# Patient Record
Sex: Male | Born: 1960 | Race: White | Hispanic: No | Marital: Single | Smoking: Former smoker
Health system: Southern US, Community
[De-identification: ages and names within clinical notes are randomized; demographics above are authoritative.]

## PROBLEM LIST (undated history)

## (undated) DIAGNOSIS — I10 Essential (primary) hypertension: Secondary | ICD-10-CM

## (undated) DIAGNOSIS — G8220 Paraplegia, unspecified: Secondary | ICD-10-CM

## (undated) DIAGNOSIS — E039 Hypothyroidism, unspecified: Secondary | ICD-10-CM

## (undated) DIAGNOSIS — L8990 Pressure ulcer of unspecified site, unspecified stage: Secondary | ICD-10-CM

## (undated) HISTORY — PX: ABOVE KNEE LEG AMPUTATION: SUR20

## (undated) HISTORY — PX: COLOSTOMY TAKEDOWN: SHX5783

## (undated) HISTORY — PX: COLOSTOMY: SHX63

## (undated) HISTORY — PX: CHOLECYSTECTOMY OPEN: SUR202

## (undated) HISTORY — PX: OTHER SURGICAL HISTORY: SHX169

## (undated) HISTORY — PX: BELOW KNEE LEG AMPUTATION: SUR23

---

## 2013-10-07 ENCOUNTER — Institutional Professional Consult (permissible substitution)
Admission: AD | Admit: 2013-10-07 | Discharge: 2013-10-21 | Disposition: A | Discharge status: Other Acute Inpatient Hospital | Payer: Medicare Other | Source: Ambulatory Visit | Attending: Internal Medicine | Admitting: Internal Medicine

## 2013-10-08 ENCOUNTER — Other Ambulatory Visit (HOSPITAL_COMMUNITY): Payer: Medicare Other

## 2013-10-08 LAB — COMPREHENSIVE METABOLIC PANEL
ALBUMIN: 1.5 g/dL — AB (ref 3.5–5.2)
ALT: 30 U/L (ref 0–53)
ANION GAP: 8 (ref 5–15)
AST: 19 U/L (ref 0–37)
Alkaline Phosphatase: 91 U/L (ref 39–117)
BILIRUBIN TOTAL: 0.2 mg/dL — AB (ref 0.3–1.2)
BUN: 27 mg/dL — ABNORMAL HIGH (ref 6–23)
CO2: 26 mEq/L (ref 19–32)
CREATININE: 1.33 mg/dL (ref 0.50–1.35)
Calcium: 7.9 mg/dL — ABNORMAL LOW (ref 8.4–10.5)
Chloride: 111 mEq/L (ref 96–112)
GFR calc Af Amer: 69 mL/min — ABNORMAL LOW (ref >90)
GFR calc non Af Amer: 60 mL/min — ABNORMAL LOW (ref >90)
Glucose, Bld: 82 mg/dL (ref 70–99)
POTASSIUM: 5 meq/L (ref 3.7–5.3)
Sodium: 145 mEq/L (ref 137–147)
Total Protein: 4.8 g/dL — ABNORMAL LOW (ref 6.0–8.3)

## 2013-10-08 LAB — CBC
HCT: 33.4 % — ABNORMAL LOW (ref 39.0–52.0)
Hemoglobin: 10.3 g/dL — ABNORMAL LOW (ref 13.0–17.0)
MCH: 30.5 pg (ref 26.0–34.0)
MCHC: 30.8 g/dL (ref 30.0–36.0)
MCV: 98.8 fL (ref 78.0–100.0)
PLATELETS: 243 10*3/uL (ref 150–400)
RBC: 3.38 MIL/uL — ABNORMAL LOW (ref 4.22–5.81)
RDW: 18.8 % — ABNORMAL HIGH (ref 11.5–15.5)
WBC: 15.4 10*3/uL — AB (ref 4.0–10.5)

## 2013-10-08 LAB — HEMOGLOBIN A1C
Hgb A1c MFr Bld: 5.7 % — ABNORMAL HIGH (ref <5.7)
Mean Plasma Glucose: 117 mg/dL — ABNORMAL HIGH (ref <117)

## 2013-10-08 LAB — TSH: TSH: 42.89 u[IU]/mL — ABNORMAL HIGH (ref 0.350–4.500)

## 2013-10-08 LAB — GENTAMICIN LEVEL, RANDOM: GENTAMICIN RM: 2.2 ug/mL

## 2013-10-08 LAB — PREALBUMIN: PREALBUMIN: 20.4 mg/dL (ref 17.0–34.0)

## 2013-10-08 NOTE — Progress Notes (Signed)
[facility]                                                                                              Progress note     Patient Demographics  Clayton Gross, is a 53 y.o. male  ZOX:[id number]  WJX:[id number]  DOB - 1960/11/25  Admit date - 10/07/2013  Admitting Physician Carron CurieAli Mardi Cannady, MD  Outpatient Primary MD for the patient is PROVIDER NOT IN SYSTEM  LOS - 1   Chief complaint   Wounds         Subjective:   Clayton Gross today has no complaints  Objective:   Vital signs  Temperature 98.8 Heart rate 94 Respiratory rate 20 Blood pressure 105/64 Pulse ox 99%    Exam Awake Alert, Oriented X 3, No new F.N deficits, Normal affect [place].AT,PERRAL Supple Neck,No JVD, No cervical lymphadenopathy appriciated.  Symmetrical Chest wall movement, Good air movement bilaterally, CTAB RRR,No Gallops,Rubs or new Murmurs, No Parasternal Heave +ve B.Sounds, Abd Soft, Non tender, No organomegaly appriciated, urostomy tube noted. Lower extremities status post bilateral amputations with multiple decubitus ulcers. Sacral decubitus ulcer unstageable noted.? Osteomyelitis    I&Os unknown   Data Review   CBC  Recent Labs Lab 10/08/13 0550  WBC 15.4*  HGB 10.3*  HCT 33.4*  PLT 243  MCV 98.8  MCH 30.5  MCHC 30.8  RDW 18.8*    Chemistries   Recent Labs Lab 10/08/13 0550  NA 145  K 5.0  CL 111  CO2 26  GLUCOSE 82  BUN 27*  CREATININE 1.33  CALCIUM 7.9*  AST 19  ALT 30  ALKPHOS 91  BILITOT 0.2*   ------------------------------------------------------------------------------------------------------------------ CrCl is unknown because there is no height on file for the current visit. ------------------------------------------------------------------------------------------------------------------  Recent Labs  10/08/13 0550  HGBA1C 5.7*    ------------------------------------------------------------------------------------------------------------------ No results found for this basename: CHOL, HDL, LDLCALC, TRIG, CHOLHDL, LDLDIRECT,  in the last 72 hours ------------------------------------------------------------------------------------------------------------------  Recent Labs  10/08/13 0550  TSH 42.890*   ------------------------------------------------------------------------------------------------------------------ No results found for this basename: VITAMINB12, FOLATE, FERRITIN, TIBC, IRON, RETICCTPCT,  in the last 72 hours  Coagulation profile No results found for this basename: INR, PROTIME,  in the last 168 hours  No results found for this basename: DDIMER,  in the last 72 hours  Cardiac Enzymes No results found for this basename: CK, CKMB, TROPONINI, MYOGLOBIN,  in the last 168 hours ------------------------------------------------------------------------------------------------------------------ No components found with this basename: POCBNP,   Micro Results No results found for this or any previous visit (from the past 240 hour(s)).     Assessment & Plan   Sacral decubitus ulcer status post debridement. History of ileal conduit. On IV antibiotics. Surgery is following Multiple decubitus ulcers in lower extremities. Status post bilateral amputation. Surgeon is following Protein calorie malnutrition tolerating by mouth Hypothyroidism on treatment with elevated TSH Acute on chronic renal failure History of line sepsis Perineal yeast infection Liver cirrhosis with history of hepatic encephalopathy Anemia status post transfusion Generalized weakness  Plan Continue IV antibiotics Wound care team consult We'll follow surgery recommendations    Code Status: Full  DVT Prophylaxis SCDs     Carron Curie M.D on 10/08/2013 at 2:45 PM

## 2013-10-09 NOTE — Progress Notes (Signed)
[facility]                                                                                              Progress note     Patient Demographics  Clayton AlarDavid Mincey, is a 53 y.o. male  ZOX:[id number]  WJX:[id number]  DOB - 1960/11/09  Admit date - 10/07/2013  Admitting Physician Carron CurieAli Angelos Wasco, MD  Outpatient Primary MD for the patient is PROVIDER NOT IN SYSTEM  LOS - 2   Chief complaint   Wounds         Subjective:   Clayton Gross today has no complaints  Objective:   Vital signs  Temperature 97.1 Heart rate 87 Respiratory rate 18 Blood pressure 103/64 Pulse ox 98%    Exam Awake Alert, Oriented X 3, No new F.N deficits, Normal affect [place].AT,PERRAL Supple Neck,No JVD, No cervical lymphadenopathy appriciated.  Symmetrical Chest wall movement, Good air movement bilaterally, CTAB RRR,No Gallops,Rubs or new Murmurs, No Parasternal Heave +ve B.Sounds, Abd Soft, Non tender, No organomegaly appriciated,ileal conduit tube noted. Lower extremities status post bilateral amputations with multiple decubitus ulcers. Sacral decubitus ulcer unstageable noted.? Osteomyelitis, no need for MRI    I&Os unknown   Data Review   CBC  Recent Labs Lab 10/08/13 0550  WBC 15.4*  HGB 10.3*  HCT 33.4*  PLT 243  MCV 98.8  MCH 30.5  MCHC 30.8  RDW 18.8*    Chemistries   Recent Labs Lab 10/08/13 0550  NA 145  K 5.0  CL 111  CO2 26  GLUCOSE 82  BUN 27*  CREATININE 1.33  CALCIUM 7.9*  AST 19  ALT 30  ALKPHOS 91  BILITOT 0.2*   ------------------------------------------------------------------------------------------------------------------ CrCl is unknown because there is no height on file for the current visit. ------------------------------------------------------------------------------------------------------------------  Recent Labs  10/08/13 0550  HGBA1C 5.7*    ------------------------------------------------------------------------------------------------------------------ No results found for this basename: CHOL, HDL, LDLCALC, TRIG, CHOLHDL, LDLDIRECT,  in the last 72 hours ------------------------------------------------------------------------------------------------------------------  Recent Labs  10/08/13 0550  TSH 42.890*   ------------------------------------------------------------------------------------------------------------------ No results found for this basename: VITAMINB12, FOLATE, FERRITIN, TIBC, IRON, RETICCTPCT,  in the last 72 hours  Coagulation profile No results found for this basename: INR, PROTIME,  in the last 168 hours  No results found for this basename: DDIMER,  in the last 72 hours  Cardiac Enzymes No results found for this basename: CK, CKMB, TROPONINI, MYOGLOBIN,  in the last 168 hours ------------------------------------------------------------------------------------------------------------------ No components found with this basename: POCBNP,   Micro Results No results found for this or any previous visit (from the past 240 hour(s)).     Assessment & Plan   Sacral decubitus ulcer status post debridement. History of ileal conduit. On IV antibiotics, history of ESBL. Surgery is following . Multiple decubitus ulcers in lower extremities. Status post bilateral amputation. Surgeon is following Protein calorie malnutrition tolerating by mouth Hypothyroidism on treatment with elevated TSH Acute on chronic renal failure History of line sepsis Perineal yeast infection Liver cirrhosis with history of hepatic encephalopathy Anemia status post transfusion Generalized weakness History of UTI with procidentia  Plan  Continue IV antibiotics  Surgical consult next week for Diverting colostomy if okay with Dr. Delice Bison follow surgery recommendations    Code Status: Full  DVT Prophylaxis SCDs      Carron Curie M.D on 10/09/2013 at 1:55 PM

## 2013-10-11 LAB — BASIC METABOLIC PANEL
ANION GAP: 9 (ref 5–15)
BUN: 39 mg/dL — ABNORMAL HIGH (ref 6–23)
CHLORIDE: 113 meq/L — AB (ref 96–112)
CO2: 22 mEq/L (ref 19–32)
Calcium: 8.1 mg/dL — ABNORMAL LOW (ref 8.4–10.5)
Creatinine, Ser: 1.35 mg/dL (ref 0.50–1.35)
GFR calc non Af Amer: 58 mL/min — ABNORMAL LOW (ref >90)
GFR, EST AFRICAN AMERICAN: 68 mL/min — AB (ref >90)
Glucose, Bld: 62 mg/dL — ABNORMAL LOW (ref 70–99)
POTASSIUM: 5.2 meq/L (ref 3.7–5.3)
SODIUM: 144 meq/L (ref 137–147)

## 2013-10-11 LAB — CBC
HCT: 33.9 % — ABNORMAL LOW (ref 39.0–52.0)
Hemoglobin: 10.1 g/dL — ABNORMAL LOW (ref 13.0–17.0)
MCH: 30.1 pg (ref 26.0–34.0)
MCHC: 29.8 g/dL — ABNORMAL LOW (ref 30.0–36.0)
MCV: 101.2 fL — ABNORMAL HIGH (ref 78.0–100.0)
PLATELETS: 249 10*3/uL (ref 150–400)
RBC: 3.35 MIL/uL — ABNORMAL LOW (ref 4.22–5.81)
RDW: 17.8 % — AB (ref 11.5–15.5)
WBC: 14.6 10*3/uL — AB (ref 4.0–10.5)

## 2013-10-12 LAB — RENAL FUNCTION PANEL
ALBUMIN: 1.6 g/dL — AB (ref 3.5–5.2)
ANION GAP: 9 (ref 5–15)
BUN: 41 mg/dL — ABNORMAL HIGH (ref 6–23)
CHLORIDE: 111 meq/L (ref 96–112)
CO2: 23 meq/L (ref 19–32)
Calcium: 8.3 mg/dL — ABNORMAL LOW (ref 8.4–10.5)
Creatinine, Ser: 1.66 mg/dL — ABNORMAL HIGH (ref 0.50–1.35)
GFR, EST AFRICAN AMERICAN: 53 mL/min — AB (ref >90)
GFR, EST NON AFRICAN AMERICAN: 46 mL/min — AB (ref >90)
Glucose, Bld: 89 mg/dL (ref 70–99)
POTASSIUM: 5.5 meq/L — AB (ref 3.7–5.3)
Phosphorus: 4.2 mg/dL (ref 2.3–4.6)
SODIUM: 143 meq/L (ref 137–147)

## 2013-10-12 NOTE — Progress Notes (Signed)
[facility]                                                                                              Progress note     Patient Demographics  Clayton AlarDavid Ratay, is a 53 y.o. male  ZOX:[id number]  WJX:[id number]  DOB - [birth year]  Admit date - 10/07/2013  Admitting Physician Carron CurieAli June Vacha, MD  Outpatient Primary MD for the patient is PROVIDER NOT IN SYSTEM  LOS - 5   Chief complaint   Wounds         Subjective:   Clayton Gross today has no complaints  Objective:   Vital signs  Temperature 97.0 Heart rate 95 Respiratory rate 18 Blood pressure 94/58 Pulse ox 98%    Exam Awake Alert, Oriented X 3, No new F.N deficits, Normal affect .AT,PERRAL Supple Neck,No JVD, No cervical lymphadenopathy appriciated.  Symmetrical Chest wall movement, Good air movement bilaterally, CTAB RRR,No Gallops,Rubs or new Murmurs, No Parasternal Heave +ve B.Sounds, Abd Soft, Non tender, No organomegaly appriciated,ileal conduit tube noted. Lower extremities status post bilateral amputations with multiple decubitus ulcers. Sacral decubitus ulcer unstageable noted.? Osteomyelitis, no need for MRI    I&Os -260   Data Review   CBC  Recent Labs Lab 10/08/13 0550 10/11/13 0500  WBC 15.4* 14.6*  HGB 10.3* 10.1*  HCT 33.4* 33.9*  PLT 243 249  MCV 98.8 101.2*  MCH 30.5 30.1  MCHC 30.8 29.8*  RDW 18.8* 17.8*    Chemistries   Recent Labs Lab 10/08/13 0550 10/11/13 0500 10/12/13 0500  NA 145 144 143  K 5.0 5.2 5.5*  CL 111 113* 111  CO2 26 22 23   GLUCOSE 82 62* 89  BUN 27* 39* 41*  CREATININE 1.33 1.35 1.66*  CALCIUM 7.9* 8.1* 8.3*  AST 19  --   --   ALT 30  --   --   ALKPHOS 91  --   --   BILITOT 0.2*  --   --    ------------------------------------------------------------------------------------------------------------------ CrCl is unknown because there is no height on file for the  current visit. ------------------------------------------------------------------------------------------------------------------ No results found for this basename: HGBA1C,  in the last 72 hours ------------------------------------------------------------------------------------------------------------------ No results found for this basename: CHOL, HDL, LDLCALC, TRIG, CHOLHDL, LDLDIRECT,  in the last 72 hours ------------------------------------------------------------------------------------------------------------------ No results found for this basename: TSH, T4TOTAL, FREET3, T3FREE, THYROIDAB,  in the last 72 hours ------------------------------------------------------------------------------------------------------------------ No results found for this basename: VITAMINB12, FOLATE, FERRITIN, TIBC, IRON, RETICCTPCT,  in the last 72 hours  Coagulation profile No results found for this basename: INR, PROTIME,  in the last 168 hours  No results found for this basename: DDIMER,  in the last 72 hours  Cardiac Enzymes No results found for this basename: CK, CKMB, TROPONINI, MYOGLOBIN,  in the last 168 hours ------------------------------------------------------------------------------------------------------------------ No components found with this basename: POCBNP,   Micro Results No results found for this or any previous visit (from the past 240 hour(s)).     Assessment & Plan   Sacral decubitus ulcer status post debridement. History of ileal conduit. On IV antibiotics, history of ESBL. Surgery is following .  Multiple decubitus ulcers in lower extremities. Status post bilateral amputation. Surgery is following. Wounds reviewed Protein calorie malnutrition tolerating by mouth Hypothyroidism on treatment with elevated TSH Acute on chronic renal failure worsening History of line sepsis Perineal yeast infection Liver cirrhosis with history of hepatic encephalopathy Anemia status  post transfusion Generalized weakness History of UTI with providencia Hypotension   Plan  IV fluids normal saline DC potassium Decreased Cardizem to daily Critical care time 34 minutes   Code Status: Full  DVT Prophylaxis SCDs     Carron Curie M.D on 10/12/2013 at 1:09 PM

## 2013-10-13 LAB — BASIC METABOLIC PANEL
Anion gap: 9 (ref 5–15)
BUN: 46 mg/dL — ABNORMAL HIGH (ref 6–23)
CO2: 22 mEq/L (ref 19–32)
Calcium: 8.3 mg/dL — ABNORMAL LOW (ref 8.4–10.5)
Chloride: 114 mEq/L — ABNORMAL HIGH (ref 96–112)
Creatinine, Ser: 1.68 mg/dL — ABNORMAL HIGH (ref 0.50–1.35)
GFR calc non Af Amer: 45 mL/min — ABNORMAL LOW (ref >90)
GFR, EST AFRICAN AMERICAN: 52 mL/min — AB (ref >90)
Glucose, Bld: 85 mg/dL (ref 70–99)
POTASSIUM: 5.7 meq/L — AB (ref 3.7–5.3)
SODIUM: 145 meq/L (ref 137–147)

## 2013-10-13 NOTE — Progress Notes (Signed)
[facility]                                                                                              Progress note     Patient Demographics  Clayton AlarDavid Stalker, is a 53 y.o. male  ZOX:[id number]  WJX:[id number]  DOB - February 15, 1961  Admit date - 10/07/2013  Admitting Physician Carron CurieAli Dajane Valli, MD  Outpatient Primary MD for the patient is PROVIDER NOT IN SYSTEM  LOS - 6   Chief complaint   Wounds         Subjective:   Clayton Gross today has no complaints  Objective:   Vital signs  Temperature 98.7 Heart rate 114 Respiratory rate 20 Blood pressure 92/56 Pulse ox 98%    Exam Awake Alert, Oriented X 3, No new F.N deficits, Normal affect [place].AT,PERRAL Supple Neck,No JVD, No cervical lymphadenopathy appriciated.  Symmetrical Chest wall movement, Good air movement bilaterally, CTAB RRR,No Gallops,Rubs or new Murmurs, No Parasternal Heave +ve B.Sounds, Abd Soft, Non tender, No organomegaly appriciated,ileal conduit tube noted. Lower extremities status post bilateral amputations with multiple decubitus ulcers. Sacral decubitus ulcer unstageable noted.? Osteomyelitis, no need for MRI    I&Os +1400   Data Review   CBC  Recent Labs Lab 10/08/13 0550 10/11/13 0500  WBC 15.4* 14.6*  HGB 10.3* 10.1*  HCT 33.4* 33.9*  PLT 243 249  MCV 98.8 101.2*  MCH 30.5 30.1  MCHC 30.8 29.8*  RDW 18.8* 17.8*    Chemistries   Recent Labs Lab 10/08/13 0550 10/11/13 0500 10/12/13 0500 10/13/13 0650  NA 145 144 143 145  K 5.0 5.2 5.5* 5.7*  CL 111 113* 111 114*  CO2 26 22 23 22   GLUCOSE 82 62* 89 85  BUN 27* 39* 41* 46*  CREATININE 1.33 1.35 1.66* 1.68*  CALCIUM 7.9* 8.1* 8.3* 8.3*  AST 19  --   --   --   ALT 30  --   --   --   ALKPHOS 91  --   --   --   BILITOT 0.2*  --   --   --     ------------------------------------------------------------------------------------------------------------------ CrCl is unknown because there is no height on file for the current visit. ------------------------------------------------------------------------------------------------------------------ No results found for this basename: HGBA1C,  in the last 72 hours ------------------------------------------------------------------------------------------------------------------ No results found for this basename: CHOL, HDL, LDLCALC, TRIG, CHOLHDL, LDLDIRECT,  in the last 72 hours ------------------------------------------------------------------------------------------------------------------ No results found for this basename: TSH, T4TOTAL, FREET3, T3FREE, THYROIDAB,  in the last 72 hours ------------------------------------------------------------------------------------------------------------------ No results found for this basename: VITAMINB12, FOLATE, FERRITIN, TIBC, IRON, RETICCTPCT,  in the last 72 hours  Coagulation profile No results found for this basename: INR, PROTIME,  in the last 168 hours  No results found for this basename: DDIMER,  in the last 72 hours  Cardiac Enzymes No results found for this basename: CK, CKMB, TROPONINI, MYOGLOBIN,  in the last 168 hours ------------------------------------------------------------------------------------------------------------------ No components found with this basename: POCBNP,   Micro Results No results found for this or any previous visit (from the past 240 hour(s)).     Assessment &  Plan   Sacral decubitus ulcer status post debridement. History of ileal conduit. On IV antibiotics, history of ESBL. Surgery is following . Multiple decubitus ulcers in lower extremities. Status post bilateral amputation. Surgery is following. Wounds reviewed Protein calorie malnutrition tolerating by mouth Hypothyroidism on treatment with  elevated TSH Acute on chronic renal failure worsening History of line sepsis Perineal yeast infection Liver cirrhosis with history of hepatic encephalopathy Anemia status post transfusion Generalized weakness History of UTI with providencia Hypotension  Hyperkalemia  Plan  Kayexalate Check BMP in a.m. Critical care time 33 minutes   Code Status: Full  DVT Prophylaxis SCDs     Carron Curie M.D on 10/13/2013 at 1:52 PM

## 2013-10-14 LAB — BASIC METABOLIC PANEL
Anion gap: 11 (ref 5–15)
BUN: 42 mg/dL — ABNORMAL HIGH (ref 6–23)
CALCIUM: 8.2 mg/dL — AB (ref 8.4–10.5)
CO2: 20 mEq/L (ref 19–32)
Chloride: 113 mEq/L — ABNORMAL HIGH (ref 96–112)
Creatinine, Ser: 1.91 mg/dL — ABNORMAL HIGH (ref 0.50–1.35)
GFR calc Af Amer: 45 mL/min — ABNORMAL LOW (ref >90)
GFR, EST NON AFRICAN AMERICAN: 38 mL/min — AB (ref >90)
GLUCOSE: 76 mg/dL (ref 70–99)
POTASSIUM: 5.4 meq/L — AB (ref 3.7–5.3)
SODIUM: 144 meq/L (ref 137–147)

## 2013-10-14 NOTE — Progress Notes (Signed)
[facility]                                                                                              Progress note     Patient Demographics  Clayton Gross, is a 53 y.o. male  WUJ:[id number]  [id number]  DOB - 1960/10/28  Admit date - 10/07/2013  Admitting Physician Carron Curie, MD  Outpatient Primary MD for the patient is PROVIDER NOT IN SYSTEM  LOS - 7   Chief complaint   Wounds         Subjective:   [name] today has no complaints  Objective:   Vital signs  Temperature 98 Heart rate 113 Respiratory rate 18 Blood pressure 92/50 Pulse ox 99%    Exam Awake Alert, Oriented X 3, No new F.N deficits, Normal affect [place].AT,PERRAL Supple Neck,No JVD, No cervical lymphadenopathy appriciated.  Symmetrical Chest wall movement, Good air movement bilaterally, CTAB RRR,No Gallops,Rubs or new Murmurs, No Parasternal Heave +ve B.Sounds, Abd Soft, Non tender, No organomegaly appriciated,ileal conduit tube noted. Lower extremities status post bilateral amputations with multiple decubitus ulcers. Sacral decubitus ulcer unstageable noted.? Osteomyelitis, no need for MRI    I&Os + 700   Data Review   CBC  Recent Labs Lab 10/08/13 0550 10/11/13 0500  WBC 15.4* 14.6*  HGB 10.3* 10.1*  HCT 33.4* 33.9*  PLT 243 249  MCV 98.8 101.2*  MCH 30.5 30.1  MCHC 30.8 29.8*  RDW 18.8* 17.8*    Chemistries   Recent Labs Lab 10/08/13 0550 10/11/13 0500 10/12/13 0500 10/13/13 0650 10/14/13 0500  NA 145 144 143 145 144  K 5.0 5.2 5.5* 5.7* 5.4*  CL 111 113* 111 114* 113*  CO2 26 22 23 22 20   GLUCOSE 82 62* 89 85 76  BUN 27* 39* 41* 46* 42*  CREATININE 1.33 1.35 1.66* 1.68* 1.91*  CALCIUM 7.9* 8.1* 8.3* 8.3* 8.2*  AST 19  --   --   --   --   ALT 30  --   --   --   --   ALKPHOS 91  --   --   --   --   BILITOT 0.2*  --   --   --   --     ------------------------------------------------------------------------------------------------------------------ CrCl is unknown because there is no height on file for the current visit. ------------------------------------------------------------------------------------------------------------------ No results found for this basename: HGBA1C,  in the last 72 hours ------------------------------------------------------------------------------------------------------------------ No results found for this basename: CHOL, HDL, LDLCALC, TRIG, CHOLHDL, LDLDIRECT,  in the last 72 hours ------------------------------------------------------------------------------------------------------------------ No results found for this basename: TSH, T4TOTAL, FREET3, T3FREE, THYROIDAB,  in the last 72 hours ------------------------------------------------------------------------------------------------------------------ No results found for this basename: VITAMINB12, FOLATE, FERRITIN, TIBC, IRON, RETICCTPCT,  in the last 72 hours  Coagulation profile No results found for this basename: INR, PROTIME,  in the last 168 hours  No results found for this basename: DDIMER,  in the last 72 hours  Cardiac Enzymes No results found for this basename: CK, CKMB, TROPONINI, MYOGLOBIN,  in the last 168 hours ------------------------------------------------------------------------------------------------------------------ No components found with this basename: POCBNP,  Micro Results No results found for this or any previous visit (from the past 240 hour(s)).     Assessment & Plan   Sacral decubitus ulcer status post debridement. History of ileal conduit. On IV antibiotics, history of ESBL. Surgery is following . Multiple decubitus ulcers in lower extremities. Status post bilateral amputation. Surgery is following. Wounds reviewed Protein calorie malnutrition tolerating by mouth Hypothyroidism on treatment with  elevated TSH Acute on chronic renal failure worsening History of line sepsis Perineal yeast infection Liver cirrhosis with history of hepatic encephalopathy Anemia status post transfusion Generalized weakness History of UTI with providencia Hypotension  Hyperkalemia  Plan  Normal saline bolus Increase normal saline to 75 mL per hour Kayexalate Check BMP in a.m. Critical Care time 35 minutes   Code Status: Full  DVT Prophylaxis SCDs     Carron CurieHijazi, Eran Mistry M.D on 10/14/2013 at 11:57 AM

## 2013-10-15 LAB — BASIC METABOLIC PANEL
Anion gap: 11 (ref 5–15)
BUN: 39 mg/dL — AB (ref 6–23)
CALCIUM: 8.3 mg/dL — AB (ref 8.4–10.5)
CO2: 21 mEq/L (ref 19–32)
Chloride: 112 mEq/L (ref 96–112)
Creatinine, Ser: 1.53 mg/dL — ABNORMAL HIGH (ref 0.50–1.35)
GFR calc Af Amer: 58 mL/min — ABNORMAL LOW (ref >90)
GFR calc non Af Amer: 50 mL/min — ABNORMAL LOW (ref >90)
GLUCOSE: 62 mg/dL — AB (ref 70–99)
POTASSIUM: 5.1 meq/L (ref 3.7–5.3)
SODIUM: 144 meq/L (ref 137–147)

## 2013-10-15 NOTE — Progress Notes (Signed)
[facility]                                                                                              Progress note     Patient Demographics  Clayton AlarDavid Nack, is a 53 y.o. male  ZOX:[id number]  WJX:[id number]  DOB - [birth year]  Admit date - 10/07/2013  Admitting Physician Carron CurieAli Sayward Horvath, MD  Outpatient Primary MD for the patient is PROVIDER NOT IN SYSTEM  LOS - 8   Chief complaint   Wounds         Subjective:   Clayton Gross today has no complaints  Objective:   Vital signs  Temperature 97 point Heart rate 108 Respiratory rate 18 Blood pressure 89/54 Pulse ox 98%    Exam Awake Alert, Oriented X 3, No new F.N deficits, Normal affect [place].AT,PERRAL Supple Neck,No JVD, No cervical lymphadenopathy appriciated.  Symmetrical Chest wall movement, Good air movement bilaterally, CTAB RRR,No Gallops,Rubs or new Murmurs, No Parasternal Heave +ve B.Sounds, Abd Soft, Non tender, No organomegaly appriciated,ileal conduit tube noted. Lower extremities status post bilateral amputations with multiple decubitus ulcers. Sacral decubitus ulcer unstageable noted.? Osteomyelitis, no need for MRI    I&Os + 390   Data Review   CBC  Recent Labs Lab 10/11/13 0500  WBC 14.6*  HGB 10.1*  HCT 33.9*  PLT 249  MCV 101.2*  MCH 30.1  MCHC 29.8*  RDW 17.8*    Chemistries   Recent Labs Lab 10/11/13 0500 10/12/13 0500 10/13/13 0650 10/14/13 0500 10/15/13 0500  NA 144 143 145 144 144  K 5.2 5.5* 5.7* 5.4* 5.1  CL 113* 111 114* 113* 112  CO2 22 23 22 20 21   GLUCOSE 62* 89 85 76 62*  BUN 39* 41* 46* 42* 39*  CREATININE 1.35 1.66* 1.68* 1.91* 1.53*  CALCIUM 8.1* 8.3* 8.3* 8.2* 8.3*   ------------------------------------------------------------------------------------------------------------------ CrCl is unknown because there is no height on file for the current  visit. ------------------------------------------------------------------------------------------------------------------ No results found for this basename: HGBA1C,  in the last 72 hours ------------------------------------------------------------------------------------------------------------------ No results found for this basename: CHOL, HDL, LDLCALC, TRIG, CHOLHDL, LDLDIRECT,  in the last 72 hours ------------------------------------------------------------------------------------------------------------------ No results found for this basename: TSH, T4TOTAL, FREET3, T3FREE, THYROIDAB,  in the last 72 hours ------------------------------------------------------------------------------------------------------------------ No results found for this basename: VITAMINB12, FOLATE, FERRITIN, TIBC, IRON, RETICCTPCT,  in the last 72 hours  Coagulation profile No results found for this basename: INR, PROTIME,  in the last 168 hours  No results found for this basename: DDIMER,  in the last 72 hours  Cardiac Enzymes No results found for this basename: CK, CKMB, TROPONINI, MYOGLOBIN,  in the last 168 hours ------------------------------------------------------------------------------------------------------------------ No components found with this basename: POCBNP,   Micro Results No results found for this or any previous visit (from the past 240 hour(s)).     Assessment & Plan   Sacral decubitus ulcer status post debridement. History of ileal conduit. On IV antibiotics, history of ESBL. Surgery is following . Multiple decubitus ulcers in lower extremities. Status post bilateral amputation. Surgery is following. Wounds reviewed Protein calorie malnutrition tolerating by mouth Hypothyroidism on  treatment with elevated TSH Acute on chronic renal failure worsening History of line sepsis Perineal yeast infection Liver cirrhosis with history of hepatic encephalopathy Anemia status post  transfusion Generalized weakness History of UTI with providencia Hypotension  Hyperkalemia  Plan  Continue same treatment Check cortisol level in a.m.  Code Status: Full  DVT Prophylaxis SCDs     Carron Curie M.D on 10/15/2013 at 12:10 PM

## 2013-10-16 NOTE — Progress Notes (Signed)
[facility]                                                                                              Progress note     Patient Demographics  Clayton AlarDavid Jenkinson, is a 53 y.o. male  ZOX:[id number]  [id number]:[id number]  DOB - 1960/10/25  Admit date - 10/07/2013  Admitting Physician Carron CurieAli Kanai Berrios, MD  Outpatient Primary MD for the patient is PROVIDER NOT IN SYSTEM  LOS - 9   Chief complaint   Wounds         Subjective:   Clayton Gross today has no complaints  Objective:   Vital signs  Temperature 98.3  Heart rate 97 Respiratory rate 20 Blood pressure 100/55 Pulse ox 99%    Exam Awake Alert, Oriented X 3, No new F.N deficits, Normal affect [place].AT,PERRAL Supple Neck,No JVD, No cervical lymphadenopathy appriciated.  Symmetrical Chest wall movement, Good air movement bilaterally, CTAB RRR,No Gallops,Rubs or new Murmurs, No Parasternal Heave +ve B.Sounds, Abd Soft, Non tender, No organomegaly appriciated,ileal conduit tube noted. Lower extremities status post bilateral amputations with multiple decubitus ulcers. Sacral decubitus ulcer unstageable noted.? Osteomyelitis, no need for MRI    I&Os 3390/1950   Data Review   CBC  Recent Labs Lab 10/11/13 0500  WBC 14.6*  HGB 10.1*  HCT 33.9*  PLT 249  MCV 101.2*  MCH 30.1  MCHC 29.8*  RDW 17.8*    Chemistries   Recent Labs Lab 10/11/13 0500 10/12/13 0500 10/13/13 0650 10/14/13 0500 10/15/13 0500  NA 144 143 145 144 144  K 5.2 5.5* 5.7* 5.4* 5.1  CL 113* 111 114* 113* 112  CO2 22 23 22 20 21   GLUCOSE 62* 89 85 76 62*  BUN 39* 41* 46* 42* 39*  CREATININE 1.35 1.66* 1.68* 1.91* 1.53*  CALCIUM 8.1* 8.3* 8.3* 8.2* 8.3*   ------------------------------------------------------------------------------------------------------------------ CrCl is unknown because there is no height on file for the current  visit. ------------------------------------------------------------------------------------------------------------------ No results found for this basename: HGBA1C,  in the last 72 hours ------------------------------------------------------------------------------------------------------------------ No results found for this basename: CHOL, HDL, LDLCALC, TRIG, CHOLHDL, LDLDIRECT,  in the last 72 hours ------------------------------------------------------------------------------------------------------------------ No results found for this basename: TSH, T4TOTAL, FREET3, T3FREE, THYROIDAB,  in the last 72 hours ------------------------------------------------------------------------------------------------------------------ No results found for this basename: VITAMINB12, FOLATE, FERRITIN, TIBC, IRON, RETICCTPCT,  in the last 72 hours  Coagulation profile No results found for this basename: INR, PROTIME,  in the last 168 hours  No results found for this basename: DDIMER,  in the last 72 hours  Cardiac Enzymes No results found for this basename: CK, CKMB, TROPONINI, MYOGLOBIN,  in the last 168 hours ------------------------------------------------------------------------------------------------------------------ No components found with this basename: POCBNP,   Micro Results No results found for this or any previous visit (from the past 240 hour(s)).     Assessment & Plan   Sacral decubitus ulcer status post debridement. History of ileal conduit. On IV antibiotics, history of ESBL. Surgery is following . Multiple decubitus ulcers in lower extremities. Status post bilateral amputation. Surgery is following. Wounds reviewed Protein calorie malnutrition tolerating by mouth Hypothyroidism on treatment  with elevated TSH Acute on chronic renal failure improving History of line sepsis Perineal yeast infection Liver cirrhosis with history of hepatic encephalopathy Anemia status post  transfusion Generalized weakness History of UTI with providencia Hypotension  Hyperkalemia  Plan  Continue same treatment Check cortisol level Surgery to decide on diverting colostomy next week  Code Status: Full  DVT Prophylaxis SCDs     Carron Curie M.D on 10/16/2013 at 12:26 PM

## 2013-10-17 LAB — CORTISOL-AM, BLOOD: CORTISOL - AM: 12.3 ug/dL (ref 4.3–22.4)

## 2013-10-18 LAB — CBC WITH DIFFERENTIAL/PLATELET
BASOS ABS: 0.1 10*3/uL (ref 0.0–0.1)
Basophils Relative: 1 % (ref 0–1)
EOS ABS: 1 10*3/uL — AB (ref 0.0–0.7)
EOS PCT: 9 % — AB (ref 0–5)
HEMATOCRIT: 30 % — AB (ref 39.0–52.0)
Hemoglobin: 9.1 g/dL — ABNORMAL LOW (ref 13.0–17.0)
LYMPHS PCT: 15 % (ref 12–46)
Lymphs Abs: 1.6 10*3/uL (ref 0.7–4.0)
MCH: 29.9 pg (ref 26.0–34.0)
MCHC: 30.3 g/dL (ref 30.0–36.0)
MCV: 98.7 fL (ref 78.0–100.0)
MONO ABS: 0.7 10*3/uL (ref 0.1–1.0)
Monocytes Relative: 6 % (ref 3–12)
Neutro Abs: 7.4 10*3/uL (ref 1.7–7.7)
Neutrophils Relative %: 69 % (ref 43–77)
Platelets: 302 10*3/uL (ref 150–400)
RBC: 3.04 MIL/uL — ABNORMAL LOW (ref 4.22–5.81)
RDW: 16.6 % — AB (ref 11.5–15.5)
WBC: 10.7 10*3/uL — ABNORMAL HIGH (ref 4.0–10.5)

## 2013-10-18 LAB — BASIC METABOLIC PANEL
ANION GAP: 10 (ref 5–15)
BUN: 49 mg/dL — ABNORMAL HIGH (ref 6–23)
CALCIUM: 8.6 mg/dL (ref 8.4–10.5)
CO2: 21 mEq/L (ref 19–32)
CREATININE: 1.43 mg/dL — AB (ref 0.50–1.35)
Chloride: 111 mEq/L (ref 96–112)
GFR calc Af Amer: 63 mL/min — ABNORMAL LOW (ref >90)
GFR, EST NON AFRICAN AMERICAN: 55 mL/min — AB (ref >90)
Glucose, Bld: 75 mg/dL (ref 70–99)
Potassium: 5.2 mEq/L (ref 3.7–5.3)
Sodium: 142 mEq/L (ref 137–147)

## 2013-10-18 LAB — MAGNESIUM: Magnesium: 2 mg/dL (ref 1.5–2.5)

## 2013-10-18 LAB — PHOSPHORUS: Phosphorus: 3.6 mg/dL (ref 2.3–4.6)

## 2013-10-19 LAB — COMPREHENSIVE METABOLIC PANEL
ALT: 50 U/L (ref 0–53)
AST: 47 U/L — ABNORMAL HIGH (ref 0–37)
Albumin: 1.8 g/dL — ABNORMAL LOW (ref 3.5–5.2)
Alkaline Phosphatase: 82 U/L (ref 39–117)
Anion gap: 10 (ref 5–15)
BUN: 48 mg/dL — ABNORMAL HIGH (ref 6–23)
CO2: 22 meq/L (ref 19–32)
CREATININE: 1.45 mg/dL — AB (ref 0.50–1.35)
Calcium: 8.5 mg/dL (ref 8.4–10.5)
Chloride: 112 mEq/L (ref 96–112)
GFR calc Af Amer: 62 mL/min — ABNORMAL LOW (ref >90)
GFR, EST NON AFRICAN AMERICAN: 54 mL/min — AB (ref >90)
Glucose, Bld: 79 mg/dL (ref 70–99)
Potassium: 4.9 mEq/L (ref 3.7–5.3)
Sodium: 144 mEq/L (ref 137–147)
Total Bilirubin: 0.2 mg/dL — ABNORMAL LOW (ref 0.3–1.2)
Total Protein: 6.3 g/dL (ref 6.0–8.3)

## 2013-10-19 LAB — PREALBUMIN: PREALBUMIN: 16.3 mg/dL — AB (ref 17.0–34.0)

## 2013-10-19 LAB — CBC
HCT: 29.5 % — ABNORMAL LOW (ref 39.0–52.0)
Hemoglobin: 9.1 g/dL — ABNORMAL LOW (ref 13.0–17.0)
MCH: 30.3 pg (ref 26.0–34.0)
MCHC: 30.8 g/dL (ref 30.0–36.0)
MCV: 98.3 fL (ref 78.0–100.0)
PLATELETS: 389 10*3/uL (ref 150–400)
RBC: 3 MIL/uL — AB (ref 4.22–5.81)
RDW: 16.6 % — ABNORMAL HIGH (ref 11.5–15.5)
WBC: 10.5 10*3/uL (ref 4.0–10.5)

## 2013-10-21 ENCOUNTER — Inpatient Hospital Stay (HOSPITAL_COMMUNITY): Payer: Medicare Other

## 2013-10-21 ENCOUNTER — Encounter (HOSPITAL_COMMUNITY): Payer: Self-pay | Admitting: Emergency Medicine

## 2013-10-21 ENCOUNTER — Inpatient Hospital Stay (HOSPITAL_COMMUNITY)
Admission: EM | Admit: 2013-10-21 | Discharge: 2013-10-27 | DRG: 981 | Disposition: A | Discharge status: Other Acute Inpatient Hospital | Payer: Medicare Other | Attending: General Surgery | Admitting: General Surgery

## 2013-10-21 DIAGNOSIS — E039 Hypothyroidism, unspecified: Secondary | ICD-10-CM | POA: Diagnosis present

## 2013-10-21 DIAGNOSIS — D62 Acute posthemorrhagic anemia: Secondary | ICD-10-CM | POA: Diagnosis not present

## 2013-10-21 DIAGNOSIS — L8994 Pressure ulcer of unspecified site, stage 4: Secondary | ICD-10-CM | POA: Diagnosis present

## 2013-10-21 DIAGNOSIS — S78119A Complete traumatic amputation at level between unspecified hip and knee, initial encounter: Secondary | ICD-10-CM

## 2013-10-21 DIAGNOSIS — K7460 Unspecified cirrhosis of liver: Secondary | ICD-10-CM | POA: Diagnosis present

## 2013-10-21 DIAGNOSIS — I252 Old myocardial infarction: Secondary | ICD-10-CM | POA: Diagnosis not present

## 2013-10-21 DIAGNOSIS — I129 Hypertensive chronic kidney disease with stage 1 through stage 4 chronic kidney disease, or unspecified chronic kidney disease: Secondary | ICD-10-CM | POA: Diagnosis present

## 2013-10-21 DIAGNOSIS — L89109 Pressure ulcer of unspecified part of back, unspecified stage: Secondary | ICD-10-CM | POA: Diagnosis present

## 2013-10-21 DIAGNOSIS — Z933 Colostomy status: Secondary | ICD-10-CM

## 2013-10-21 DIAGNOSIS — S88119A Complete traumatic amputation at level between knee and ankle, unspecified lower leg, initial encounter: Secondary | ICD-10-CM

## 2013-10-21 DIAGNOSIS — L89154 Pressure ulcer of sacral region, stage 4: Secondary | ICD-10-CM

## 2013-10-21 DIAGNOSIS — L89309 Pressure ulcer of unspecified buttock, unspecified stage: Secondary | ICD-10-CM | POA: Diagnosis present

## 2013-10-21 DIAGNOSIS — N189 Chronic kidney disease, unspecified: Secondary | ICD-10-CM

## 2013-10-21 DIAGNOSIS — Z5331 Laparoscopic surgical procedure converted to open procedure: Secondary | ICD-10-CM

## 2013-10-21 DIAGNOSIS — Z79899 Other long term (current) drug therapy: Secondary | ICD-10-CM

## 2013-10-21 DIAGNOSIS — I9589 Other hypotension: Secondary | ICD-10-CM | POA: Diagnosis not present

## 2013-10-21 DIAGNOSIS — L89159 Pressure ulcer of sacral region, unspecified stage: Secondary | ICD-10-CM | POA: Diagnosis present

## 2013-10-21 DIAGNOSIS — G8220 Paraplegia, unspecified: Secondary | ICD-10-CM | POA: Diagnosis present

## 2013-10-21 DIAGNOSIS — K660 Peritoneal adhesions (postprocedural) (postinfection): Secondary | ICD-10-CM | POA: Diagnosis present

## 2013-10-21 DIAGNOSIS — Z936 Other artificial openings of urinary tract status: Secondary | ICD-10-CM

## 2013-10-21 DIAGNOSIS — M62838 Other muscle spasm: Secondary | ICD-10-CM | POA: Diagnosis not present

## 2013-10-21 HISTORY — DX: Essential (primary) hypertension: I10

## 2013-10-21 HISTORY — DX: Hypothyroidism, unspecified: E03.9

## 2013-10-21 HISTORY — DX: Pressure ulcer of unspecified site, unspecified stage: L89.90

## 2013-10-21 HISTORY — DX: Paraplegia, unspecified: G82.20

## 2013-10-21 LAB — COMPREHENSIVE METABOLIC PANEL
ALT: 110 U/L — AB (ref 0–53)
AST: 87 U/L — AB (ref 0–37)
Albumin: 2 g/dL — ABNORMAL LOW (ref 3.5–5.2)
Alkaline Phosphatase: 105 U/L (ref 39–117)
Anion gap: 13 (ref 5–15)
BILIRUBIN TOTAL: 0.2 mg/dL — AB (ref 0.3–1.2)
BUN: 62 mg/dL — ABNORMAL HIGH (ref 6–23)
CALCIUM: 8.8 mg/dL (ref 8.4–10.5)
CHLORIDE: 108 meq/L (ref 96–112)
CO2: 21 mEq/L (ref 19–32)
Creatinine, Ser: 1.44 mg/dL — ABNORMAL HIGH (ref 0.50–1.35)
GFR calc Af Amer: 63 mL/min — ABNORMAL LOW (ref >90)
GFR calc non Af Amer: 54 mL/min — ABNORMAL LOW (ref >90)
Glucose, Bld: 82 mg/dL (ref 70–99)
Potassium: 5.3 mEq/L (ref 3.7–5.3)
SODIUM: 142 meq/L (ref 137–147)
Total Protein: 7.3 g/dL (ref 6.0–8.3)

## 2013-10-21 LAB — PROTIME-INR
INR: 1.13 (ref 0.00–1.49)
Prothrombin Time: 14.5 seconds (ref 11.6–15.2)

## 2013-10-21 LAB — CBC WITH DIFFERENTIAL/PLATELET
BASOS ABS: 0.1 10*3/uL (ref 0.0–0.1)
Basophils Relative: 1 % (ref 0–1)
Eosinophils Absolute: 1.2 10*3/uL — ABNORMAL HIGH (ref 0.0–0.7)
Eosinophils Relative: 12 % — ABNORMAL HIGH (ref 0–5)
HCT: 31.9 % — ABNORMAL LOW (ref 39.0–52.0)
Hemoglobin: 9.8 g/dL — ABNORMAL LOW (ref 13.0–17.0)
Lymphocytes Relative: 21 % (ref 12–46)
Lymphs Abs: 2.2 10*3/uL (ref 0.7–4.0)
MCH: 30.6 pg (ref 26.0–34.0)
MCHC: 30.7 g/dL (ref 30.0–36.0)
MCV: 99.7 fL (ref 78.0–100.0)
Monocytes Absolute: 0.7 10*3/uL (ref 0.1–1.0)
Monocytes Relative: 6 % (ref 3–12)
NEUTROS ABS: 6.4 10*3/uL (ref 1.7–7.7)
Neutrophils Relative %: 60 % (ref 43–77)
PLATELETS: 516 10*3/uL — AB (ref 150–400)
RBC: 3.2 MIL/uL — ABNORMAL LOW (ref 4.22–5.81)
RDW: 16.1 % — AB (ref 11.5–15.5)
WBC: 10.7 10*3/uL — AB (ref 4.0–10.5)

## 2013-10-21 MED ORDER — SODIUM CHLORIDE 0.9 % IJ SOLN
10.0000 mL | INTRAMUSCULAR | Status: DC | PRN
Start: 2013-10-21 — End: 2013-10-27
  Administered 2013-10-25 – 2013-10-27 (×3): 10 mL

## 2013-10-21 MED ORDER — DIPHENHYDRAMINE HCL 12.5 MG/5ML PO ELIX
12.5000 mg | ORAL_SOLUTION | Freq: Four times a day (QID) | ORAL | Status: DC | PRN
  Filled 2013-10-21: qty 10

## 2013-10-21 MED ORDER — POLYETHYLENE GLYCOL 3350 17 G PO PACK
17.0000 g | PACK | Freq: Every day | ORAL | Status: DC
  Administered 2013-10-22: 17 g via ORAL
  Filled 2013-10-21 (×2): qty 1

## 2013-10-21 MED ORDER — SODIUM CHLORIDE 0.9 % IV BOLUS (SEPSIS)
1000.0000 mL | Freq: Once | INTRAVENOUS | Status: AC
  Administered 2013-10-21: 1000 mL via INTRAVENOUS

## 2013-10-21 MED ORDER — FERROUS SULFATE 325 (65 FE) MG PO TABS
325.0000 mg | ORAL_TABLET | Freq: Every day | ORAL | Status: DC
  Administered 2013-10-22 – 2013-10-27 (×6): 325 mg via ORAL
  Filled 2013-10-21 (×8): qty 1

## 2013-10-21 MED ORDER — MORPHINE SULFATE 2 MG/ML IJ SOLN: 1.0000 mg | INTRAMUSCULAR | Status: DC | PRN

## 2013-10-21 MED ORDER — ACETAMINOPHEN 325 MG PO TABS
650.0000 mg | ORAL_TABLET | Freq: Four times a day (QID) | ORAL | Status: DC | PRN
  Administered 2013-10-23 – 2013-10-27 (×2): 650 mg via ORAL
  Filled 2013-10-21 (×2): qty 2

## 2013-10-21 MED ORDER — RIFAXIMIN 550 MG PO TABS
550.0000 mg | ORAL_TABLET | Freq: Two times a day (BID) | ORAL | Status: DC
  Administered 2013-10-22 – 2013-10-27 (×12): 550 mg via ORAL
  Filled 2013-10-21 (×17): qty 1

## 2013-10-21 MED ORDER — OXYCODONE HCL 5 MG PO TABS: 5.0000 mg | ORAL_TABLET | ORAL | Status: DC | PRN

## 2013-10-21 MED ORDER — ACETAMINOPHEN 650 MG RE SUPP: 650.0000 mg | Freq: Four times a day (QID) | RECTAL | Status: DC | PRN

## 2013-10-21 MED ORDER — SODIUM CHLORIDE 0.9 % IJ SOLN
10.0000 mL | Freq: Two times a day (BID) | INTRAMUSCULAR | Status: DC
  Administered 2013-10-22 – 2013-10-26 (×5): 10 mL

## 2013-10-21 MED ORDER — CALCIUM POLYCARBOPHIL 625 MG PO TABS
625.0000 mg | ORAL_TABLET | Freq: Every day | ORAL | Status: DC
  Administered 2013-10-22 – 2013-10-23 (×2): via ORAL
  Administered 2013-10-24 – 2013-10-25 (×2): 625 mg via ORAL
  Administered 2013-10-26: 10:00:00 via ORAL
  Administered 2013-10-27: 625 mg via ORAL
  Filled 2013-10-21 (×7): qty 1

## 2013-10-21 MED ORDER — DEXTROSE 5 % IV SOLN
2.0000 g | INTRAVENOUS | Status: DC
  Filled 2013-10-21: qty 2

## 2013-10-21 MED ORDER — KCL IN DEXTROSE-NACL 10-5-0.45 MEQ/L-%-% IV SOLN
INTRAVENOUS | Status: DC
  Administered 2013-10-22 – 2013-10-24 (×5): via INTRAVENOUS
  Filled 2013-10-21 (×11): qty 1000

## 2013-10-21 MED ORDER — ONDANSETRON HCL 4 MG/2ML IJ SOLN
4.0000 mg | Freq: Four times a day (QID) | INTRAMUSCULAR | Status: DC | PRN
  Administered 2013-10-23: 4 mg via INTRAVENOUS
  Filled 2013-10-21: qty 2

## 2013-10-21 MED ORDER — DIPHENHYDRAMINE HCL 50 MG/ML IJ SOLN: 12.5000 mg | Freq: Four times a day (QID) | INTRAMUSCULAR | Status: DC | PRN

## 2013-10-21 MED ORDER — IOHEXOL 300 MG/ML  SOLN
25.0000 mL | INTRAMUSCULAR | Status: AC
  Administered 2013-10-21: 25 mL via ORAL

## 2013-10-21 MED ORDER — ALTEPLASE 2 MG IJ SOLR
2.0000 mg | Freq: Once | INTRAMUSCULAR | Status: AC
  Administered 2013-10-21: 2 mg
  Filled 2013-10-21: qty 2

## 2013-10-21 MED ORDER — SODIUM BICARBONATE 650 MG PO TABS
650.0000 mg | ORAL_TABLET | Freq: Two times a day (BID) | ORAL | Status: DC
  Administered 2013-10-22 – 2013-10-27 (×12): 650 mg via ORAL
  Filled 2013-10-21 (×15): qty 1

## 2013-10-21 MED ORDER — ENOXAPARIN SODIUM 40 MG/0.4ML ~~LOC~~ SOLN
40.0000 mg | Freq: Once | SUBCUTANEOUS | Status: DC
  Filled 2013-10-21: qty 0.4

## 2013-10-21 MED ORDER — IOHEXOL 300 MG/ML  SOLN: 25.0000 mL | INTRAMUSCULAR | Status: DC | PRN

## 2013-10-21 MED ORDER — DILTIAZEM HCL ER COATED BEADS 120 MG PO CP24
120.0000 mg | ORAL_CAPSULE | Freq: Every day | ORAL | Status: DC
  Administered 2013-10-22 – 2013-10-27 (×6): 120 mg via ORAL
  Filled 2013-10-21 (×7): qty 1

## 2013-10-21 MED ORDER — GLUCERNA SHAKE PO LIQD: 237.0000 mL | Freq: Three times a day (TID) | ORAL | Status: DC

## 2013-10-21 MED ORDER — LEVOTHYROXINE SODIUM 112 MCG PO TABS
112.0000 ug | ORAL_TABLET | Freq: Every day | ORAL | Status: DC
  Administered 2013-10-22 – 2013-10-27 (×6): 112 ug via ORAL
  Filled 2013-10-21 (×8): qty 1

## 2013-10-21 NOTE — H&P (Signed)
I saw the patient, participated in the history, exam and medical decision making, and concur with the physician assistant's note above.  Multiple prior abd surgeries, including colostomy at some point with subsequent reversal ??? Multiple old abd scars; ileal conduit  Given extensive prior abd surgery and prior colonic surgery and no available old op notes will get ct to help delineate colonic anatomy for surgical anatomy   Tentative end colostomy on Thursday after i review imaging in am; doubt will able to do laparoscopically  Will give 1 dose miralax this evening Npo after mn Will complete remainder of preop in am Check 12 lead EKG - any abnormalities may require cards clearance  Mary SellaEric M. Andrey CampanileWilson, MD, FACS General, Bariatric, & Minimally Invasive Surgery [facility], [place]

## 2013-10-21 NOTE — ED Notes (Signed)
[facility] consult to evaluate if he is a candidate for colostomy.

## 2013-10-21 NOTE — ED Notes (Signed)
[facility] contacted and updated about patient status.

## 2013-10-21 NOTE — Consult Note (Addendum)
WOC consult requested to mark for colostomy; surgery scheduled for tomorrow.  CCS team following for assessment and plan of care to wound. Pt spends a large amt time sitting in a wheelchair, he states.  Assessed pt while sitting upright in bed.  Mark placed next to a previous ostomy site on left upper quad; area within rectus muscles, in area free from folds, within line of vision. No umbilicus to use as reference point, but site located approx 7 cm to left of where umbilicus would usually be located, and 2 cm above.  Pt states he is familiar with colostomies since he had one when he was a teenager. He denies further questions regarding procedure at this time.  He has a urostomy to right upper quad; pouch intact with good seal.  He uses a 2 piece convex Convatec pouching system which is not available in [facility] supply formulary.  2 Hollister 2 piece convex pouching appliances left at bedside for use when pouch is due to be changed.  He states he is independent with pouch application.  Urostomy pouch attached to bedside drainage bag.  Plan to follow post-op for teaching sessions after colostomy surgery. Cammie Mcgeeawn Nadia Viar MSN, RN, CWOCN, [place]-AP, CNS [phone]

## 2013-10-21 NOTE — ED Notes (Signed)
IV team paged because TPA just arrived from Pharmacy
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
## 2013-10-21 NOTE — ED Notes (Signed)
CT contacted to inform that patient is finished drinking contrast.

## 2013-10-21 NOTE — ED Notes (Signed)
Spoke with IV team. Stated that she would order TPA to open PICC and I am to call her back when it arrives from pharmacy.

## 2013-10-21 NOTE — ED Notes (Addendum)
IV team re-paged.  °

## 2013-10-21 NOTE — ED Notes (Signed)
Phlebotomist at bedside.

## 2013-10-21 NOTE — ED Notes (Signed)
One RN attempted to access picc line with no success. IV team paged.

## 2013-10-21 NOTE — ED Notes (Signed)
Pt updated about accessing PICC, and notified that phlebotomist will have to stick him for blood tests due to delay in PICC access.

## 2013-10-21 NOTE — ED Notes (Signed)
IV team at bedside 

## 2013-10-21 NOTE — ED Notes (Signed)
Pt is in room 37 E

## 2013-10-21 NOTE — Progress Notes (Signed)
1000cc Bolus NS started after IV team used TPa to clear PICC Line for administration.
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
## 2013-10-22 ENCOUNTER — Encounter (HOSPITAL_COMMUNITY): Payer: Self-pay | Admitting: Anesthesiology

## 2013-10-22 ENCOUNTER — Inpatient Hospital Stay (HOSPITAL_COMMUNITY): Payer: Medicare Other

## 2013-10-22 ENCOUNTER — Encounter (HOSPITAL_COMMUNITY): Payer: Medicare Other | Admitting: Anesthesiology

## 2013-10-22 ENCOUNTER — Inpatient Hospital Stay (HOSPITAL_COMMUNITY): Payer: Medicare Other | Admitting: Anesthesiology

## 2013-10-22 ENCOUNTER — Encounter (HOSPITAL_COMMUNITY)
Admission: EM | Disposition: A | Discharge status: Other Acute Inpatient Hospital | Payer: Self-pay | Source: Home / Self Care

## 2013-10-22 HISTORY — PX: LAPAROSCOPIC PARTIAL COLECTOMY: SHX5907

## 2013-10-22 HISTORY — PX: LYSIS OF ADHESION: SHX5961

## 2013-10-22 HISTORY — PX: LAPAROSCOPY: SHX197

## 2013-10-22 HISTORY — PX: LAPAROTOMY: SHX154

## 2013-10-22 LAB — CBC
HEMATOCRIT: 27 % — AB (ref 39.0–52.0)
HEMATOCRIT: 20.7 % — AB (ref 39.0–52.0)
HEMOGLOBIN: 6.7 g/dL — AB (ref 13.0–17.0)
Hemoglobin: 8.5 g/dL — ABNORMAL LOW (ref 13.0–17.0)
MCH: 31 pg (ref 26.0–34.0)
MCH: 31.2 pg (ref 26.0–34.0)
MCHC: 31.5 g/dL (ref 30.0–36.0)
MCHC: 32.4 g/dL (ref 30.0–36.0)
MCV: 98.5 fL (ref 78.0–100.0)
MCV: 96.3 fL (ref 78.0–100.0)
Platelets: 473 10*3/uL — ABNORMAL HIGH (ref 150–400)
Platelets: 425 10*3/uL — ABNORMAL HIGH (ref 150–400)
RBC: 2.74 MIL/uL — ABNORMAL LOW (ref 4.22–5.81)
RBC: 2.15 MIL/uL — ABNORMAL LOW (ref 4.22–5.81)
RDW: 16.2 % — AB (ref 11.5–15.5)
RDW: 15.9 % — ABNORMAL HIGH (ref 11.5–15.5)
WBC: 9.3 10*3/uL (ref 4.0–10.5)
WBC: 10.2 10*3/uL (ref 4.0–10.5)

## 2013-10-22 LAB — BASIC METABOLIC PANEL
ANION GAP: 10 (ref 5–15)
Anion gap: 10 (ref 5–15)
BUN: 53 mg/dL — ABNORMAL HIGH (ref 6–23)
BUN: 39 mg/dL — ABNORMAL HIGH (ref 6–23)
CALCIUM: 8 mg/dL — AB (ref 8.4–10.5)
CO2: 21 mEq/L (ref 19–32)
CO2: 19 mEq/L (ref 19–32)
CREATININE: 1.22 mg/dL (ref 0.50–1.35)
Calcium: 7.5 mg/dL — ABNORMAL LOW (ref 8.4–10.5)
Chloride: 108 mEq/L (ref 96–112)
Chloride: 110 mEq/L (ref 96–112)
Creatinine, Ser: 1.07 mg/dL (ref 0.50–1.35)
GFR calc non Af Amer: 66 mL/min — ABNORMAL LOW (ref >90)
GFR calc non Af Amer: 77 mL/min — ABNORMAL LOW (ref >90)
GFR, EST AFRICAN AMERICAN: 77 mL/min — AB (ref >90)
GFR, EST AFRICAN AMERICAN: 90 mL/min — AB (ref >90)
GLUCOSE: 72 mg/dL (ref 70–99)
Glucose, Bld: 81 mg/dL (ref 70–99)
POTASSIUM: 4.7 meq/L (ref 3.7–5.3)
Potassium: 4.6 mEq/L (ref 3.7–5.3)
SODIUM: 139 meq/L (ref 137–147)
Sodium: 139 mEq/L (ref 137–147)

## 2013-10-22 LAB — PREPARE RBC (CROSSMATCH)

## 2013-10-22 LAB — SURGICAL PCR SCREEN
MRSA, PCR: NEGATIVE
STAPHYLOCOCCUS AUREUS: NEGATIVE

## 2013-10-22 LAB — GLUCOSE, CAPILLARY: GLUCOSE-CAPILLARY: 73 mg/dL (ref 70–99)

## 2013-10-22 LAB — ABO/RH: ABO/RH(D): A POS

## 2013-10-22 LAB — MRSA PCR SCREENING: MRSA BY PCR: NEGATIVE

## 2013-10-22 SURGERY — LAPAROSCOPIC PARTIAL COLECTOMY
Anesthesia: General | Site: Abdomen

## 2013-10-22 MED ORDER — STERILE WATER FOR INJECTION IJ SOLN
INTRAMUSCULAR | Status: AC
  Filled 2013-10-22: qty 10

## 2013-10-22 MED ORDER — HYDROMORPHONE HCL PF 1 MG/ML IJ SOLN
INTRAMUSCULAR | Status: AC
  Filled 2013-10-22: qty 1

## 2013-10-22 MED ORDER — LACTATED RINGERS IV SOLN
INTRAVENOUS | Status: DC
  Administered 2013-10-22 (×2): via INTRAVENOUS

## 2013-10-22 MED ORDER — BUPIVACAINE-EPINEPHRINE (PF) 0.25% -1:200000 IJ SOLN
INTRAMUSCULAR | Status: AC
  Filled 2013-10-22: qty 30

## 2013-10-22 MED ORDER — OXYCODONE HCL 5 MG/5ML PO SOLN: 5.0000 mg | Freq: Once | ORAL | Status: AC | PRN

## 2013-10-22 MED ORDER — MIDAZOLAM HCL 2 MG/2ML IJ SOLN
INTRAMUSCULAR | Status: AC
  Filled 2013-10-22: qty 2

## 2013-10-22 MED ORDER — ALBUMIN HUMAN 5 % IV SOLN
INTRAVENOUS | Status: AC
  Filled 2013-10-22: qty 250

## 2013-10-22 MED ORDER — ROCURONIUM BROMIDE 50 MG/5ML IV SOLN
INTRAVENOUS | Status: AC
  Filled 2013-10-22: qty 1

## 2013-10-22 MED ORDER — SUCCINYLCHOLINE CHLORIDE 20 MG/ML IJ SOLN
INTRAMUSCULAR | Status: DC | PRN
  Administered 2013-10-22: 120 mg via INTRAVENOUS

## 2013-10-22 MED ORDER — ALBUMIN HUMAN 5 % IV SOLN
INTRAVENOUS | Status: DC | PRN
  Administered 2013-10-22: 13:00:00 via INTRAVENOUS

## 2013-10-22 MED ORDER — SODIUM CHLORIDE 0.9 % IV SOLN
Freq: Once | INTRAVENOUS | Status: AC
  Administered 2013-10-22: 23:00:00 via INTRAVENOUS

## 2013-10-22 MED ORDER — ONDANSETRON HCL 4 MG/2ML IJ SOLN
INTRAMUSCULAR | Status: AC
Start: 2013-10-22 — End: 2013-10-22
  Filled 2013-10-22: qty 2

## 2013-10-22 MED ORDER — ONDANSETRON HCL 4 MG/2ML IJ SOLN
INTRAMUSCULAR | Status: DC | PRN
  Administered 2013-10-22: 4 mg via INTRAVENOUS

## 2013-10-22 MED ORDER — ALBUMIN HUMAN 5 % IV SOLN
12.5000 g | Freq: Once | INTRAVENOUS | Status: AC
  Administered 2013-10-22: 12.5 g via INTRAVENOUS

## 2013-10-22 MED ORDER — PROPOFOL 10 MG/ML IV BOLUS
INTRAVENOUS | Status: DC | PRN
  Administered 2013-10-22: 30 mg via INTRAVENOUS

## 2013-10-22 MED ORDER — DEXTROSE 5 % IV SOLN
2.0000 g | INTRAVENOUS | Status: AC
  Administered 2013-10-22: 2 g via INTRAVENOUS
  Filled 2013-10-22: qty 2

## 2013-10-22 MED ORDER — ROCURONIUM BROMIDE 100 MG/10ML IV SOLN
INTRAVENOUS | Status: DC | PRN
  Administered 2013-10-22: 10 mg via INTRAVENOUS
  Administered 2013-10-22: 25 mg via INTRAVENOUS
  Administered 2013-10-22: 5 mg via INTRAVENOUS
  Administered 2013-10-22: 10 mg via INTRAVENOUS

## 2013-10-22 MED ORDER — PHENYLEPHRINE HCL 10 MG/ML IJ SOLN
10.0000 mg | INTRAVENOUS | Status: DC | PRN
  Administered 2013-10-22: 25 ug/min via INTRAVENOUS

## 2013-10-22 MED ORDER — LACTATED RINGERS IV SOLN
INTRAVENOUS | Status: DC | PRN
  Administered 2013-10-22: 13:00:00 via INTRAVENOUS

## 2013-10-22 MED ORDER — MORPHINE SULFATE 2 MG/ML IJ SOLN
1.0000 mg | INTRAMUSCULAR | Status: DC | PRN
  Administered 2013-10-22 – 2013-10-24 (×4): 2 mg via INTRAVENOUS
  Filled 2013-10-22 (×4): qty 1

## 2013-10-22 MED ORDER — OXYCODONE HCL 5 MG PO TABS
5.0000 mg | ORAL_TABLET | Freq: Once | ORAL | Status: AC | PRN
  Administered 2013-10-22: 5 mg via ORAL

## 2013-10-22 MED ORDER — MIDAZOLAM HCL 5 MG/5ML IJ SOLN
INTRAMUSCULAR | Status: DC | PRN
  Administered 2013-10-22: 1 mg via INTRAVENOUS

## 2013-10-22 MED ORDER — ENOXAPARIN SODIUM 40 MG/0.4ML ~~LOC~~ SOLN
40.0000 mg | SUBCUTANEOUS | Status: DC
  Administered 2013-10-23 – 2013-10-27 (×5): 40 mg via SUBCUTANEOUS
  Filled 2013-10-22 (×5): qty 0.4

## 2013-10-22 MED ORDER — NEOSTIGMINE METHYLSULFATE 10 MG/10ML IV SOLN
INTRAVENOUS | Status: AC
  Filled 2013-10-22: qty 1

## 2013-10-22 MED ORDER — NEOSTIGMINE METHYLSULFATE 10 MG/10ML IV SOLN
INTRAVENOUS | Status: DC | PRN
  Administered 2013-10-22: 4 mg via INTRAVENOUS

## 2013-10-22 MED ORDER — FENTANYL CITRATE 0.05 MG/ML IJ SOLN
INTRAMUSCULAR | Status: DC | PRN
  Administered 2013-10-22 (×2): 50 ug via INTRAVENOUS
  Administered 2013-10-22: 100 ug via INTRAVENOUS
  Administered 2013-10-22: 50 ug via INTRAVENOUS

## 2013-10-22 MED ORDER — GLYCOPYRROLATE 0.2 MG/ML IJ SOLN
INTRAMUSCULAR | Status: DC | PRN
  Administered 2013-10-22: 0.6 mg via INTRAVENOUS

## 2013-10-22 MED ORDER — BUPIVACAINE-EPINEPHRINE 0.25% -1:200000 IJ SOLN
INTRAMUSCULAR | Status: DC | PRN
  Administered 2013-10-22: 3 mL

## 2013-10-22 MED ORDER — LACTATED RINGERS IV SOLN
INTRAVENOUS | Status: DC | PRN
  Administered 2013-10-22: 12:00:00 via INTRAVENOUS

## 2013-10-22 MED ORDER — GLYCOPYRROLATE 0.2 MG/ML IJ SOLN
INTRAMUSCULAR | Status: AC
  Filled 2013-10-22: qty 2

## 2013-10-22 MED ORDER — OXYCODONE HCL 5 MG PO TABS
ORAL_TABLET | ORAL | Status: AC
  Filled 2013-10-22: qty 3

## 2013-10-22 MED ORDER — ONDANSETRON HCL 4 MG/2ML IJ SOLN: 4.0000 mg | Freq: Once | INTRAMUSCULAR | Status: DC | PRN

## 2013-10-22 MED ORDER — HYDROMORPHONE HCL PF 1 MG/ML IJ SOLN
0.2500 mg | INTRAMUSCULAR | Status: DC | PRN
  Administered 2013-10-22: 1 mg via INTRAVENOUS

## 2013-10-22 MED ORDER — PROPOFOL 10 MG/ML IV BOLUS
INTRAVENOUS | Status: AC
  Filled 2013-10-22: qty 20

## 2013-10-22 MED ORDER — 0.9 % SODIUM CHLORIDE (POUR BTL) OPTIME
TOPICAL | Status: DC | PRN
  Administered 2013-10-22 (×2): 1000 mL

## 2013-10-22 MED ORDER — FENTANYL CITRATE 0.05 MG/ML IJ SOLN
INTRAMUSCULAR | Status: AC
  Filled 2013-10-22: qty 5

## 2013-10-22 MED ORDER — SODIUM CHLORIDE 0.9 % IV SOLN
INTRAVENOUS | Status: DC | PRN
  Administered 2013-10-22: 13:00:00 via INTRAVENOUS

## 2013-10-22 MED ORDER — VECURONIUM BROMIDE 10 MG IV SOLR
INTRAVENOUS | Status: AC
  Filled 2013-10-22: qty 10

## 2013-10-22 MED ORDER — ETOMIDATE 2 MG/ML IV SOLN
INTRAVENOUS | Status: DC | PRN
  Administered 2013-10-22: 20 mg via INTRAVENOUS

## 2013-10-22 MED ORDER — SODIUM CHLORIDE 0.9 % IV SOLN: Freq: Once | INTRAVENOUS | Status: DC

## 2013-10-22 MED ORDER — ARTIFICIAL TEARS OP OINT
TOPICAL_OINTMENT | OPHTHALMIC | Status: AC
  Filled 2013-10-22: qty 3.5

## 2013-10-22 SURGICAL SUPPLY — 53 items
BLADE SURG 10 STRL SS (BLADE) ×3 IMPLANT
CANISTER SUCTION 2500CC (MISCELLANEOUS) ×3 IMPLANT
CELLS DAT CNTRL 66122 CELL SVR (MISCELLANEOUS) IMPLANT
CHLORAPREP W/TINT 26ML (MISCELLANEOUS) ×3 IMPLANT
COVER SURGICAL LIGHT HANDLE (MISCELLANEOUS) ×3 IMPLANT
DRAPE UTILITY 15X26 W/TAPE STR (DRAPE) ×6 IMPLANT
DRAPE WARM FLUID 44X44 (DRAPE) ×3 IMPLANT
DRSG OPSITE POSTOP 4X10 (GAUZE/BANDAGES/DRESSINGS) IMPLANT
DRSG OPSITE POSTOP 4X8 (GAUZE/BANDAGES/DRESSINGS) IMPLANT
ELECT BLADE 6.5 EXT (BLADE) IMPLANT
ELECT CAUTERY BLADE 6.4 (BLADE) ×3 IMPLANT
ELECT REM PT RETURN 9FT ADLT (ELECTROSURGICAL) ×3
ELECTRODE REM PT RTRN 9FT ADLT (ELECTROSURGICAL) ×1 IMPLANT
GEL ULTRASOUND 20GR AQUASONIC (MISCELLANEOUS) IMPLANT
GLOVE BIO SURGEON STRL SZ8 (GLOVE) ×3 IMPLANT
GLOVE BIOGEL M STRL SZ7.5 (GLOVE) ×9 IMPLANT
GLOVE BIOGEL PI IND STRL 7.0 (GLOVE) ×3 IMPLANT
GLOVE BIOGEL PI IND STRL 8 (GLOVE) ×2 IMPLANT
GLOVE BIOGEL PI INDICATOR 7.0 (GLOVE) ×6
GLOVE BIOGEL PI INDICATOR 8 (GLOVE) ×4
GLOVE ECLIPSE 7.0 STRL STRAW (GLOVE) ×3 IMPLANT
GLOVE SURG SS PI 7.0 STRL IVOR (GLOVE) ×9 IMPLANT
GOWN STRL REUS W/ TWL LRG LVL3 (GOWN DISPOSABLE) ×4 IMPLANT
GOWN STRL REUS W/ TWL XL LVL3 (GOWN DISPOSABLE) ×2 IMPLANT
GOWN STRL REUS W/TWL LRG LVL3 (GOWN DISPOSABLE) ×8
GOWN STRL REUS W/TWL XL LVL3 (GOWN DISPOSABLE) ×4
KIT BASIN OR (CUSTOM PROCEDURE TRAY) ×3 IMPLANT
KIT OSTOMY DRAINABLE 2.75 STR (WOUND CARE) ×3 IMPLANT
LIGASURE IMPACT 36 18CM CVD LR (INSTRUMENTS) ×3 IMPLANT
NS IRRIG 1000ML POUR BTL (IV SOLUTION) ×6 IMPLANT
PAD ARMBOARD 7.5X6 YLW CONV (MISCELLANEOUS) ×6 IMPLANT
PENCIL BUTTON HOLSTER BLD 10FT (ELECTRODE) ×3 IMPLANT
RTRCTR WOUND ALEXIS 18CM MED (MISCELLANEOUS)
SPECIMEN JAR LARGE (MISCELLANEOUS) IMPLANT
SPONGE GAUZE 4X4 12PLY STER LF (GAUZE/BANDAGES/DRESSINGS) ×3 IMPLANT
SPONGE LAP 18X18 X RAY DECT (DISPOSABLE) ×3 IMPLANT
STAPLER PROXIMATE 75MM BLUE (STAPLE) ×3 IMPLANT
STAPLER VISISTAT 35W (STAPLE) ×3 IMPLANT
SUCTION POOLE TIP (SUCTIONS) ×3 IMPLANT
SUT NOVA 1 T20/GS 25DT (SUTURE) ×9 IMPLANT
SUT PDS AB 1 TP1 96 (SUTURE) ×6 IMPLANT
SUT SILK 2 0 SH CR/8 (SUTURE) ×3 IMPLANT
SUT SILK 2 0 TIES 10X30 (SUTURE) ×3 IMPLANT
SUT SILK 3 0 SH CR/8 (SUTURE) ×3 IMPLANT
SUT SILK 3 0 TIES 10X30 (SUTURE) ×3 IMPLANT
SUT VIC AB 3-0 SH 18 (SUTURE) ×6 IMPLANT
SYS LAPSCP GELPORT 120MM (MISCELLANEOUS)
SYSTEM LAPSCP GELPORT 120MM (MISCELLANEOUS) IMPLANT
TOWEL OR 17X26 10 PK STRL BLUE (TOWEL DISPOSABLE) ×3 IMPLANT
TRAY LAPAROSCOPIC (CUSTOM PROCEDURE TRAY) ×3 IMPLANT
TUBE CONNECTING 12'X1/4 (SUCTIONS) ×1
TUBE CONNECTING 12X1/4 (SUCTIONS) ×2 IMPLANT
YANKAUER SUCT BULB TIP NO VENT (SUCTIONS) ×3 IMPLANT
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
## 2013-10-22 NOTE — Progress Notes (Signed)
See my note from today  Mary SellaEric M. Andrey CampanileWilson, MD, FACS General, Bariatric, & Minimally Invasive Surgery [facility], [place]

## 2013-10-22 NOTE — Anesthesia Preprocedure Evaluation (Addendum)
Anesthesia Evaluation  Patient identified by MRN, date of birth, ID band Patient awake    Reviewed: Allergy & Precautions, H&P , NPO status , Patient's Chart, lab work & pertinent test results, reviewed documented beta blocker date and time   Airway Mallampati: II TM Distance: >3 FB Neck ROM: Full    Dental  (+) Teeth Intact, Dental Advisory Given   Pulmonary former smoker,  breath sounds clear to auscultation        Cardiovascular hypertension, Rhythm:Regular Rate:Normal     Neuro/Psych    GI/Hepatic   Endo/Other  Hypothyroidism   Renal/GU Renal disease     Musculoskeletal   Abdominal   Peds  Hematology   Anesthesia Other Findings   Reproductive/Obstetrics                          Anesthesia Physical Anesthesia Plan  ASA: III  Anesthesia Plan: General   Post-op Pain Management:    Induction: Intravenous  Airway Management Planned: Oral ETT  Additional Equipment:   Intra-op Plan:   Post-operative Plan: Extubation in OR  Informed Consent: I have reviewed the patients History and Physical, chart, labs and discussed the procedure including the risks, benefits and alternatives for the proposed anesthesia with the patient or authorized representative who has indicated his/her understanding and acceptance.   Dental advisory given  Plan Discussed with: CRNA, Anesthesiologist and Surgeon  Anesthesia Plan Comments:         Anesthesia Quick Evaluation
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
## 2013-10-22 NOTE — Progress Notes (Signed)
Care of pt assumed by MA Mihran Lebarron RN from M. Brande RN 

## 2013-10-22 NOTE — Progress Notes (Signed)
H&H called to Dr Janee Mornhompson (on call for Andrey CampanileWilson). Fully updated . Pt will be transfused.

## 2013-10-22 NOTE — Progress Notes (Signed)
Pt arrived on unit at ~1821. BP 87/47, HR 79 . Pt Alert & Responsive. Will continue to monitor pt.

## 2013-10-22 NOTE — Progress Notes (Signed)
SBP 81/46, SR 83. Kelly PA for Dr Andrey CampanileWilson updated. She will speak with dr Andrey CampanileWilson and call me back (preop BP 97/56.

## 2013-10-22 NOTE — Transfer of Care (Signed)
Immediate Anesthesia Transfer of Care Note  Patient: [name]  Procedure(s) Performed: Procedure(s): END COLOSTOMY (N/A) DIAGNOSTIC LAPAROSCOPY CONVERTED TO OPEN (N/A) LYSIS OF ADHESIONS (N/A) EXPLORATORY LAPAROTOMY (N/A)  Patient Location: PACU  Anesthesia Type:General  Level of Consciousness: awake, alert  and oriented  Airway & Oxygen Therapy: Patient Spontanous Breathing and Patient connected to face mask oxygen  Post-op Assessment: Report given to PACU RN  Post vital signs: Reviewed and stable  Complications: No apparent anesthesia complications

## 2013-10-22 NOTE — Progress Notes (Signed)
Dr Randa EvensEdwards here, updated. PA Osbourne had said it was OK to tx pt to stepdown with SBP in the 80's.

## 2013-10-22 NOTE — Progress Notes (Signed)
Unknown JimKelly Osbourne PA called back, new orders for CBC, BMET and change pt to go to stepdown status. Labs drawn and sent.

## 2013-10-22 NOTE — Progress Notes (Signed)
SBP oin the 80's, NSR 90, pt comfortable. Dr Randa EvensEdwards notified. New ord for IV Albumin. Will cont to monitor closely.

## 2013-10-22 NOTE — Brief Op Note (Signed)
10/21/2013 - 10/22/2013  3:05 PM  PATIENT:  Clayton Gross  53 y.o. male  PRE-OPERATIVE DIAGNOSIS:  Massive Sacral Decubitis Ulcer  POST-OPERATIVE DIAGNOSIS:  Massive Sacral Decubitis Ulcer  PROCEDURE:  Procedure(s): END COLOSTOMY (N/A) DIAGNOSTIC LAPAROSCOPY CONVERTED TO OPEN (N/A) LYSIS OF ADHESIONS (N/A) EXPLORATORY LAPAROTOMY (N/A)  SURGEON:  Surgeon(s) and Role:    * Atilano InaEric M Itxel Wickard, MD - Primary    * Liz MaladyBurke E Thompson, MD - Assisting  PHYSICIAN ASSISTANT: none  ASSISTANTS: Dr Janee Mornhompson   ANESTHESIA:   general  EBL:  Total I/O In: 1800 [I.V.:1550; IV Piggyback:250] Out: 750 [Urine:650; Blood:100]  BLOOD ADMINISTERED:none  DRAINS: urostomy; colostomy   LOCAL MEDICATIONS USED:  MARCAINE     SPECIMEN:  No Specimen  DISPOSITION OF SPECIMEN:  N/A  COUNTS:  YES  TOURNIQUET:  * No tourniquets in log *  DICTATION: .Other Dictation: Dictation Number [id number]  PLAN OF CARE: pacu then floor  PATIENT DISPOSITION:  PACU - hemodynamically stable.   Delay start of Pharmacological VTE agent (>24hrs) due to surgical blood loss or risk of bleeding: no

## 2013-10-23 ENCOUNTER — Encounter (HOSPITAL_COMMUNITY): Payer: Self-pay | Admitting: General Surgery

## 2013-10-23 DIAGNOSIS — D62 Acute posthemorrhagic anemia: Secondary | ICD-10-CM

## 2013-10-23 DIAGNOSIS — I959 Hypotension, unspecified: Secondary | ICD-10-CM

## 2013-10-23 LAB — CBC
HCT: 32.6 % — ABNORMAL LOW (ref 39.0–52.0)
Hemoglobin: 10.6 g/dL — ABNORMAL LOW (ref 13.0–17.0)
MCH: 30.6 pg (ref 26.0–34.0)
MCHC: 32.5 g/dL (ref 30.0–36.0)
MCV: 94.2 fL (ref 78.0–100.0)
PLATELETS: 392 10*3/uL (ref 150–400)
RBC: 3.46 MIL/uL — AB (ref 4.22–5.81)
RDW: 16.7 % — AB (ref 11.5–15.5)
WBC: 7.3 10*3/uL (ref 4.0–10.5)

## 2013-10-23 LAB — BASIC METABOLIC PANEL
ANION GAP: 10 (ref 5–15)
BUN: 34 mg/dL — ABNORMAL HIGH (ref 6–23)
CALCIUM: 8 mg/dL — AB (ref 8.4–10.5)
CO2: 21 meq/L (ref 19–32)
Chloride: 109 mEq/L (ref 96–112)
Creatinine, Ser: 1.14 mg/dL (ref 0.50–1.35)
GFR calc Af Amer: 83 mL/min — ABNORMAL LOW (ref >90)
GFR, EST NON AFRICAN AMERICAN: 72 mL/min — AB (ref >90)
Glucose, Bld: 77 mg/dL (ref 70–99)
Potassium: 5 mEq/L (ref 3.7–5.3)
SODIUM: 140 meq/L (ref 137–147)

## 2013-10-23 MED ORDER — OXYCODONE HCL 5 MG PO TABS
5.0000 mg | ORAL_TABLET | ORAL | Status: DC | PRN
  Administered 2013-10-23: 10 mg via ORAL
  Filled 2013-10-23: qty 2

## 2013-10-23 MED ORDER — PNEUMOCOCCAL VAC POLYVALENT 25 MCG/0.5ML IJ INJ
0.5000 mL | INJECTION | INTRAMUSCULAR | Status: AC
  Administered 2013-10-24: 0.5 mL via INTRAMUSCULAR
  Filled 2013-10-23: qty 0.5

## 2013-10-23 MED ORDER — FENTANYL CITRATE 0.05 MG/ML IJ SOLN
50.0000 ug | Freq: Once | INTRAMUSCULAR | Status: AC
  Administered 2013-10-23: 50 ug via INTRAVENOUS
  Filled 2013-10-23: qty 2

## 2013-10-23 NOTE — Progress Notes (Signed)
Spoke with Santina Evansatherine on [employer] who stated no cell phone was brought up to the desk when the patients room 6n01 was cleaned.  Santina EvansCatherine went and looked in patients bedside table where he said the cell phone was left and she did not find any cell phone.  I reported this to patient who then stated he may have [facility] for the 2 weeks prior to admission.
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
## 2013-10-23 NOTE — Consult Note (Signed)
WOC follow-up:  Bedside nurse changed the ursotomy pouch earlier this AM.  Convex pouching system intact with good seal, mod amt urine in bedside drainage bag.  Supplies at bedside for staff nurse use.  Colostomy stoma to LUQ.  Red and viable, above skin level.  Visualized through pouch since this is the first post-op day.  No stool or flatus in pouch, small amt red drainage.  Pt states he is familiar with pouch application and emptying since he currently is independent with urosotmy pouching and he had a colostomy as a teenager.  Denies further questions at this time.  Supplies ordered to bedside for staff nurse use until patient feels strong enough to be independent with pouching activities. Cammie Mcgeeawn Annamaria Salah MSN, RN, CWOCN, [place], CNS [phone]

## 2013-10-23 NOTE — Progress Notes (Signed)
Pt seen and examined this evening Some soft BP yesterday after OR - pt was in 90s preop Did get 2u prbc overnight  No c/o other than sore Alert, nad Midline c/d/i Ostomy - edematous, no air in bag. Some hyperemia/areas of purplish discoloration  Ostomy viable Clears. Await return of bowel function Cont wound care to sacrum  Mary SellaEric M. Andrey CampanileWilson, MD, FACS General, Bariatric, & Minimally Invasive Surgery [facility], [place]

## 2013-10-23 NOTE — Plan of Care (Signed)
Problem: Phase I Progression Outcomes Goal: OOB as tolerated unless otherwise ordered Outcome: Not Applicable Date Met:  06/98/61 Pt paraplegic with sacral ulcer  Goal: Voiding-avoid urinary catheter unless indicated Outcome: Not Applicable Date Met:  48/30/73 Patient has urostomy

## 2013-10-23 NOTE — Clinical Documentation Improvement (Signed)
Possible Clinical Conditions?   _______CKD Stage I - GFR > OR = 90 _______CKD Stage II - GFR 60-80 _______CKD Stage III - GFR 30-59 _______CKD Stage IV - GFR 15-29 _______CKD Stage V - GFR < 15 _______ESRD (End Stage Renal Disease) _______Other condition_____________ _______Cannot Clinically determine   Supporting Information: Component     Latest Ref Rng 10/21/2013 10/22/2013 10/22/2013 10/23/2013          5:00 AM  4:48 PM   BUN     6 - 23 mg/dL 62 (H) 53 (H) 39 (H) 34 (H)  Creatinine     0.50 - 1.35 mg/dL 2.131.44 (H) 0.861.22 5.781.07 4.691.14  Calcium     8.4 - 10.5 mg/dL 8.8 8.0 (L) 7.5 (L) 8.0 (L)  GFR calc non Af Amer     >90 mL/min 54 (L) 66 (L) 77 (L) 72 (L)    Risk Factors: h/o hypertension, hypothyroidism,  status post ileoconduit.  Hypotensive,BP 93/57 which is his norm   Chronic kidney disease   Treatment: Monitor I&O, VS                     IVF  Thank You, Clayton Gross ,RN Clinical Documentation Specialist:  [phone]  [facility]- Health Information Management

## 2013-10-23 NOTE — Op Note (Signed)
NAMEMarland Gross  TIMARION, AGCAOILI NO.:  [id number]  MEDICAL RECORD NO.:  [id number]  LOCATION:  2C16C                        FACILITY:  [facility]  PHYSICIAN:  Mary Sella. Andrey Campanile, MD, FACSDATE OF BIRTH:  July 02, 1960  DATE OF PROCEDURE:  10/22/2013 DATE OF DISCHARGE:                              OPERATIVE REPORT   PREOPERATIVE DIAGNOSES: 1. Massive stage IV sacral decubitus ulcer. 2. Need for diverting colostomy for wound protection and wound     healing.  POSTOPERATIVE DIAGNOSES: 1. Massive stage IV sacral decubitus ulcer. 2. Need for diverting colostomy for wound protection and wound     healing.Clayton Gross  PROCEDURE: 1. Diagnostic laparoscopy converted to exploratory laparotomy. 2. Lysis of adhesions for 30 minutes. 3. Creation of end-colostomy (distal transverse colon).  SURGEON:  Mary Sella. Andrey Campanile, MD, FACS.  ASSISTANT SURGEON:  Gabrielle Dare. Janee Morn, M.D.  ANESTHESIA:  General.  ESTIMATED BLOOD LOSS:  Minimal.  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  However, recently he has developed a devastating stage IV sacral decubitus and is having stool getting to his wound preventing wound healing and we were requested to perform an end-colostomy.  After discussing the nature of an end-colostomy, the patient was on board with the procedure.  The patient had, had numerous prior abdominal surgeries including a ileal conduit, open cholecystectomy, a prior colostomy which was taken down.  Therefore, we discussed the low likelihood of being able to do a laparoscopic approach.  However, I was not optimistic that would be feasible given the extensive nature of his prior surgeries.  We also discussed that he was at high-risk for injury to surrounding structures, enterotomy, incisional hernia, parastomal hernia, and wound infection given his deconditioning.  I reviewed his anatomy by ordering  a CT scan, which showed that his sigmoid colon was actually in his right lower quadrant near his ileal conduit, so therefore preoperatively I told him that more than likely, he would end up with a more proximal end- colostomy probably around the level of his transverse colon.  Please see my notes for further details regarding discussion.  DESCRIPTION OF PROCEDURE:  The patient was taken to the OR1, placed supine on the operating room table.  General endotracheal anesthesia was established.  The urostomy bag was removed. The abdominal wall was ChloraPrep and we placed Betadine around the urostomy site and placed a gauze and the Ioban over it.  He was then draped in the usual standard surgical fashion.  He had numerous surgical scars.  I made a small incision in his upper midline and lifted up the fascia and incised it with a pair of Metzenbaum.  I was able to finger sweep and that felt somewhat clear.  A pursestring suture was placed and a Hasson trocar was placed and pneumoperitoneum was established.  Laparoscope was inserted and there was just dense numerous adhesions and, therefore, I decided to convert to an open procedure.  I extended his midline incision up towards his xiphoid and a little bit inferiorly.  He had numerous omental adhesions to the anterior abdominal wall.  These were  lysed bluntly as well as with Metzenbaum scissors.  I was able to identify the stomach and the transverse colon.  He had some loops of small bowel tethered to his transverse colon.  Lysing these adhesions sharply with the Metzenbaum, which took about 30 minutes.  Due to the extensive nature of the intra-abdominal adhesions, I felt there is no point in exploring down low especially around the sigmoid colon, which in fact was around his urostomy and there was dense small bowel adhesions in that right lower quadrant.  I felt the safest course of action was just to divert him in his distal transverse colon.   At this point, Dr. Janee Mornhompson joined me.  We reviewed the anatomy and we confirmed the location of the transverse colon and divided.  A small rent was made in the transverse colon mesentery and then it was divided with a GIA-75 stapler with a blue load.  I then took down some of the mesentery in order to achieve length on the proximal end.  I was able to bring it up to the abdominal wall for the patient had been marked by the wound ostomy nurse preoperatively.  At this point, a circular skin incision was made at the marking site with the Bovie electrocautery.  The fascia was incised in a cruciate fashion and 2 fingers were able to go through the defect.  The end-colostomy was brought out through the abdominal wall.  There was adequate length.  I inspected the area of the small bowel that where I had lysed adhesions and it all appeared normal.  I then reapproximated the abdominal wall with interrupted #1 Novafil sutures.  The patient literally has no subcutaneous tissue, so he may feel some of the subcutaneous Novafil sutures.  The skin was closed with skin staples.  At this point, I matured the end-colostomy.  The staple line was excised with electrocautery.  There was adequate perfusion to the colon as evidenced by some bleeding.  I then brought to the colostomy in 4 quadrants with 3-0 Vicryl and then placed interrupted sutures.  The ostomy was nice rosebud shape.  An ostomy device was applied around the colostomy.  The urostomy device was reapplied and a sterile dressing was applied to the midline.  All needle, instrument, and sponge counts were correct x2.  There were no immediate complications.  The patient tolerated the procedure well.     Mary SellaEric M. Andrey CampanileWilson, MD, FACS     EMW/MEDQ  D:  10/22/2013  T:  10/22/2013  Job:  [id number]

## 2013-10-24 DIAGNOSIS — L89309 Pressure ulcer of unspecified buttock, unspecified stage: Secondary | ICD-10-CM | POA: Diagnosis not present

## 2013-10-24 LAB — TYPE AND SCREEN
ABO/RH(D): A POS
Antibody Screen: NEGATIVE
Unit division: 0
Unit division: 0

## 2013-10-24 LAB — CBC
HCT: 29.2 % — ABNORMAL LOW (ref 39.0–52.0)
HCT: 33.3 % — ABNORMAL LOW (ref 39.0–52.0)
Hemoglobin: 9.2 g/dL — ABNORMAL LOW (ref 13.0–17.0)
Hemoglobin: 10.7 g/dL — ABNORMAL LOW (ref 13.0–17.0)
MCH: 29.7 pg (ref 26.0–34.0)
MCH: 29.8 pg (ref 26.0–34.0)
MCHC: 31.5 g/dL (ref 30.0–36.0)
MCHC: 32.1 g/dL (ref 30.0–36.0)
MCV: 94.2 fL (ref 78.0–100.0)
MCV: 92.8 fL (ref 78.0–100.0)
PLATELETS: 360 10*3/uL (ref 150–400)
PLATELETS: 388 10*3/uL (ref 150–400)
RBC: 3.1 MIL/uL — ABNORMAL LOW (ref 4.22–5.81)
RBC: 3.59 MIL/uL — ABNORMAL LOW (ref 4.22–5.81)
RDW: 17 % — AB (ref 11.5–15.5)
RDW: 16.6 % — AB (ref 11.5–15.5)
WBC: 6.4 10*3/uL (ref 4.0–10.5)
WBC: 7.7 10*3/uL (ref 4.0–10.5)

## 2013-10-24 LAB — COMPREHENSIVE METABOLIC PANEL
ALT: 48 U/L (ref 0–53)
ANION GAP: 9 (ref 5–15)
AST: 23 U/L (ref 0–37)
Albumin: 2 g/dL — ABNORMAL LOW (ref 3.5–5.2)
Alkaline Phosphatase: 84 U/L (ref 39–117)
BUN: 24 mg/dL — AB (ref 6–23)
CO2: 21 meq/L (ref 19–32)
Calcium: 8.2 mg/dL — ABNORMAL LOW (ref 8.4–10.5)
Chloride: 109 mEq/L (ref 96–112)
Creatinine, Ser: 1.08 mg/dL (ref 0.50–1.35)
GFR calc Af Amer: 89 mL/min — ABNORMAL LOW (ref >90)
GFR, EST NON AFRICAN AMERICAN: 77 mL/min — AB (ref >90)
GLUCOSE: 80 mg/dL (ref 70–99)
POTASSIUM: 5.1 meq/L (ref 3.7–5.3)
Sodium: 139 mEq/L (ref 137–147)
TOTAL PROTEIN: 6 g/dL (ref 6.0–8.3)
Total Bilirubin: 0.3 mg/dL (ref 0.3–1.2)

## 2013-10-24 MED ORDER — KCL IN DEXTROSE-NACL 10-5-0.45 MEQ/L-%-% IV SOLN
INTRAVENOUS | Status: DC
  Administered 2013-10-25 – 2013-10-27 (×5): via INTRAVENOUS
  Filled 2013-10-24 (×7): qty 1000

## 2013-10-24 MED ORDER — GLUCERNA SHAKE PO LIQD
237.0000 mL | Freq: Three times a day (TID) | ORAL | Status: DC
  Administered 2013-10-24 – 2013-10-26 (×3): 237 mL via ORAL

## 2013-10-24 MED ORDER — CALCITRIOL 0.25 MCG PO CAPS
0.2500 ug | ORAL_CAPSULE | Freq: Every day | ORAL | Status: DC
  Administered 2013-10-24 – 2013-10-27 (×4): 0.25 ug via ORAL
  Filled 2013-10-24 (×4): qty 1

## 2013-10-24 NOTE — Progress Notes (Signed)
Received report from Abla, RN on 2C.

## 2013-10-24 NOTE — Progress Notes (Signed)
Stoma is edematous, may slough but I digitalized it today and is patent and pink right below surface, it is functional , transverse colostomy risk of prolapse present, will tx to floor, advance diet, bp low but asx

## 2013-10-24 NOTE — Progress Notes (Signed)
[facility] cell [facility] (from room [phone]) and given to pt before leaving our unit to 6N.

## 2013-10-24 NOTE — Progress Notes (Signed)
Dressing change done on sacrum, L upper thigh and left lower leg. Run out of Xeroform petroleum dressings, so used regular petroleum dressings. Dressing change done on the abdominal incision too. Pt denies pain at this moment.
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
## 2013-10-24 NOTE — Progress Notes (Signed)
Patient ID: Clayton Gross, male   DOB: [birth year], 53 y.o.   MRN: [id number]     [facility]      [address] [place], [place] [address]    Phone: [phone] FAX: [phone]     Subjective: Tolerating clears.    Objective:  Vital signs:  Filed Vitals:   10/24/13 0423 10/24/13 0500 10/24/13 0600 10/24/13 0700  BP: 94/46 83/46 85/43  80/40  Pulse: 97 86 67 84  Temp: 97.5 F (36.4 C)     TempSrc: Oral     Resp: 14  17 15   Height:      Weight:      SpO2: 97% 99% 99% 100%    Last BM Date: 10/21/13  Intake/Output   Yesterday:  08/07 0701 - 08/08 0700 In: 1770 [P.O.:960; I.V.:810] Out: 2550 [Urine:2550] This shift:    I/O last 3 completed shifts: In: 3460.8 [P.O.:1080; I.V.:1398.3; Blood:982.5] Out: 3750 [Urine:3750]    Physical Exam:  General: Pt awake/alert/oriented x4 in no acute distress  Chest: cta. No chest wall pain w good excursion  CV: Pulses intact. Regular rhythm  Abdomen: Soft. Nondistended. Midline wound-staples in place, no erythema or drainage. L stoma is mildly swollen, pink and purple, minimal serosanguinous output in bag. No evidence of peritonitis. No incarcerated hernias     Problem List:   Principal Problem:   Sacral decubitus ulcer Active Problems:   Presence of urostomy   Paraplegia   Chronic kidney disease    Results:   Labs: Results for orders placed during the hospital encounter of 10/21/13 (from the past 48 hour(s))  TYPE AND SCREEN     Status: None   Collection Time    10/22/13 11:10 AM      Result Value Ref Range   ABO/RH(D) A POS     Antibody Screen NEG     Sample Expiration 10/25/2013     Unit Number [id number]     Blood Component Type RED CELLS,LR     Unit division 00     Status of Unit ISSUED,FINAL     Transfusion Status OK TO TRANSFUSE     Crossmatch Result Compatible     Unit Number [id number]     Blood Component Type RED CELLS,LR     Unit division 00     Status of Unit ISSUED     Transfusion Status OK TO TRANSFUSE     Crossmatch Result Compatible    ABO/RH     Status: None   Collection Time    10/22/13 11:10 AM      Result Value Ref Range   ABO/RH(D) A POS    CBC     Status: Abnormal   Collection Time    10/22/13  4:48 PM      Result Value Ref Range   WBC 10.2  4.0 - 10.5 K/uL   RBC 2.15 (*) 4.22 - 5.81 MIL/uL   Hemoglobin 6.7 (*) 13.0 - 17.0 g/dL   Comment: SPECIMEN CHECKED FOR CLOTS     REPEATED TO VERIFY     CRITICAL RESULT CALLED TO, READ BACK BY AND VERIFIED WITH:     M SHAVER,RN 1731 10/22/13 WBOND     DELTA CHECK NOTED   HCT 20.7 (*) 39.0 - 52.0 %   MCV 96.3  78.0 - 100.0 fL   MCH 31.2  26.0 - 34.0 pg   MCHC 32.4  30.0 - 36.0 g/dL   RDW 15.9 (*) 11.5 -  15.5 %   Platelets 425 (*) 150 - 400 K/uL  BASIC METABOLIC PANEL     Status: Abnormal   Collection Time    10/22/13  4:48 PM      Result Value Ref Range   Sodium 139  137 - 147 mEq/L   Potassium 4.7  3.7 - 5.3 mEq/L   Chloride 110  96 - 112 mEq/L   CO2 19  19 - 32 mEq/L   Glucose, Bld 72  70 - 99 mg/dL   BUN 39 (*) 6 - 23 mg/dL   Creatinine, Ser 1.07  0.50 - 1.35 mg/dL   Calcium 7.5 (*) 8.4 - 10.5 mg/dL   GFR calc non Af Amer 77 (*) >90 mL/min   GFR calc Af Amer 90 (*) >90 mL/min   Comment: (NOTE)     The eGFR has been calculated using the CKD EPI equation.     This calculation has not been validated in all clinical situations.     eGFR's persistently <90 mL/min signify possible Chronic Kidney     Disease.   Anion gap 10  5 - 15  PREPARE RBC (CROSSMATCH)     Status: None   Collection Time    10/22/13  5:53 PM      Result Value Ref Range   Order Confirmation ORDER PROCESSED BY BLOOD BANK    MRSA PCR SCREENING     Status: None   Collection Time    10/22/13  8:01 PM      Result Value Ref Range   MRSA by PCR NEGATIVE  NEGATIVE   Comment:            The GeneXpert MRSA Assay (FDA     approved for NASAL specimens     only), is one component of a      comprehensive MRSA colonization     surveillance program. It is not     intended to diagnose MRSA     infection nor to guide or     monitor treatment for     MRSA infections.  GLUCOSE, CAPILLARY     Status: None   Collection Time    10/22/13  8:16 PM      Result Value Ref Range   Glucose-Capillary 73  70 - 99 mg/dL  BASIC METABOLIC PANEL     Status: Abnormal   Collection Time    10/23/13  6:00 AM      Result Value Ref Range   Sodium 140  137 - 147 mEq/L   Potassium 5.0  3.7 - 5.3 mEq/L   Chloride 109  96 - 112 mEq/L   CO2 21  19 - 32 mEq/L   Glucose, Bld 77  70 - 99 mg/dL   BUN 34 (*) 6 - 23 mg/dL   Creatinine, Ser 1.14  0.50 - 1.35 mg/dL   Calcium 8.0 (*) 8.4 - 10.5 mg/dL   GFR calc non Af Amer 72 (*) >90 mL/min   GFR calc Af Amer 83 (*) >90 mL/min   Comment: (NOTE)     The eGFR has been calculated using the CKD EPI equation.     This calculation has not been validated in all clinical situations.     eGFR's persistently <90 mL/min signify possible Chronic Kidney     Disease.   Anion gap 10  5 - 15  CBC     Status: Abnormal   Collection Time    10/23/13  6:00 AM  Result Value Ref Range   WBC 7.3  4.0 - 10.5 K/uL   RBC 3.46 (*) 4.22 - 5.81 MIL/uL   Hemoglobin 10.6 (*) 13.0 - 17.0 g/dL   Comment: POST TRANSFUSION SPECIMEN   HCT 32.6 (*) 39.0 - 52.0 %   MCV 94.2  78.0 - 100.0 fL   MCH 30.6  26.0 - 34.0 pg   MCHC 32.5  30.0 - 36.0 g/dL   RDW 16.7 (*) 11.5 - 15.5 %   Platelets 392  150 - 400 K/uL  CBC     Status: Abnormal   Collection Time    10/24/13  5:25 AM      Result Value Ref Range   WBC 6.4  4.0 - 10.5 K/uL   RBC 3.10 (*) 4.22 - 5.81 MIL/uL   Hemoglobin 9.2 (*) 13.0 - 17.0 g/dL   HCT 29.2 (*) 39.0 - 52.0 %   MCV 94.2  78.0 - 100.0 fL   MCH 29.7  26.0 - 34.0 pg   MCHC 31.5  30.0 - 36.0 g/dL   RDW 17.0 (*) 11.5 - 15.5 %   Platelets 360  150 - 400 K/uL  CBC     Status: Abnormal   Collection Time    10/24/13  7:10 AM      Result Value Ref Range   WBC  7.7  4.0 - 10.5 K/uL   RBC 3.59 (*) 4.22 - 5.81 MIL/uL   Hemoglobin 10.7 (*) 13.0 - 17.0 g/dL   HCT 33.3 (*) 39.0 - 52.0 %   MCV 92.8  78.0 - 100.0 fL   MCH 29.8  26.0 - 34.0 pg   MCHC 32.1  30.0 - 36.0 g/dL   RDW 16.6 (*) 11.5 - 15.5 %   Platelets 388  150 - 400 K/uL  COMPREHENSIVE METABOLIC PANEL     Status: Abnormal   Collection Time    10/24/13  7:10 AM      Result Value Ref Range   Sodium 139  137 - 147 mEq/L   Potassium 5.1  3.7 - 5.3 mEq/L   Chloride 109  96 - 112 mEq/L   CO2 21  19 - 32 mEq/L   Glucose, Bld 80  70 - 99 mg/dL   BUN 24 (*) 6 - 23 mg/dL   Creatinine, Ser 1.08  0.50 - 1.35 mg/dL   Calcium 8.2 (*) 8.4 - 10.5 mg/dL   Total Protein 6.0  6.0 - 8.3 g/dL   Albumin 2.0 (*) 3.5 - 5.2 g/dL   AST 23  0 - 37 U/L   ALT 48  0 - 53 U/L   Alkaline Phosphatase 84  39 - 117 U/L   Total Bilirubin 0.3  0.3 - 1.2 mg/dL   GFR calc non Af Amer 77 (*) >90 mL/min   GFR calc Af Amer 89 (*) >90 mL/min   Comment: (NOTE)     The eGFR has been calculated using the CKD EPI equation.     This calculation has not been validated in all clinical situations.     eGFR's persistently <90 mL/min signify possible Chronic Kidney     Disease.   Anion gap 9  5 - 15    Imaging / Studies: No results found.  Medications / Allergies:  Scheduled Meds: . sodium chloride   Intravenous Once  . diltiazem  120 mg Oral Daily  . enoxaparin (LOVENOX) injection  40 mg Subcutaneous Q24H  . ferrous sulfate  325 mg Oral Q  breakfast  . levothyroxine  112 mcg Oral QAC breakfast  . pneumococcal 23 valent vaccine  0.5 mL Intramuscular Tomorrow-1000  . polycarbophil  625 mg Oral Daily  . rifaximin  550 mg Oral BID  . sodium bicarbonate  650 mg Oral BID  . sodium chloride  10-40 mL Intracatheter Q12H   Continuous Infusions: . dextrose 5 % and 0.45 % NaCl with KCl 10 mEq/L 125 mL/hr at 10/24/13 0804   PRN Meds:.acetaminophen, acetaminophen, diphenhydrAMINE, diphenhydrAMINE, morphine injection,  ondansetron, oxyCODONE, sodium chloride  Antibiotics: Anti-infectives   Start     Dose/Rate Route Frequency Ordered Stop   10/22/13 1000  [MAR Hold]  cefoTEtan (CEFOTAN) 2 g in dextrose 5 % 50 mL IVPB     (On MAR Hold since 10/22/13 1045)   2 g 100 mL/hr over 30 Minutes Intravenous On call to O.R. 10/22/13 0915 10/22/13 1244   10/22/13 0600  cefOXitin (MEFOXIN) 2 g in dextrose 5 % 50 mL IVPB  Status:  Discontinued     2 g 100 mL/hr over 30 Minutes Intravenous On call to O.R. 10/21/13 1501 10/21/13 1620   10/21/13 1530  rifaximin (XIFAXAN) tablet 550 mg     550 mg Oral 2 times daily 10/21/13 1501        Assessment/Plan  Massive sacral decubitus ulcer  bil AKA Hypothyroidism  Paraplegia  ileoconduit  Liver cirrhosis  POD#2 diagnostic laparoscopy converted to open LOA, End colostomy---Dr. Redmond Pulling  -c/w clears and await bowel function -PO pain meds, minimize IV pain meds to prevent hypotension  -WOC consult -monitor stoma(pink and purple)  -dressing change to sacral decub  -IVF  -SCD/lovenox  ABL anemia-s/p 2units of PRBCs, stable h&h today. hypotension-BP runs low, asymptomatic.  C/w IVF and monitor.   Dispo-transfer to floor.  Plan to [facility] to [facility], CM on board   Erby Pian, [facility] Pager [phone] Office [phone]  10/24/2013 8:36 AM

## 2013-10-24 NOTE — Progress Notes (Signed)
Patient arrived via hospital bed with air overlay mattress.  VSS.  Pt oriented to 6North, room, call bell.  No complaints at this time.  RN noted that Urostomy tube was leaking where the connector meets the straight drainage bag.  New connector applied.  Will cont to monitor

## 2013-10-25 NOTE — Progress Notes (Signed)
3 Days Post-Op  Subjective: PT HAVING MUSCLE SPASMS  Objective: Vital signs in last 24 hours: Temp:  [96.1 F (35.6 C)-99 F (37.2 C)] 99 F (37.2 C) (08/09 0537) Pulse Rate:  [85-99] 99 (08/09 0537) Resp:  [16-25] 18 (08/09 0537) BP: (88-109)/(48-67) 102/64 mmHg (08/09 0537) SpO2:  [95 %-100 %] 95 % (08/09 0537) Last BM Date: 10/24/13 (colostomy)  Intake/Output from previous day: 08/08 0701 - 08/09 0700 In: 2646.7 [P.O.:360; I.V.:2286.7] Out: 3300 [Urine:3200; Stool:100] Intake/Output this shift:    Incision/Wound:ABDOMEN WITH MILD DISTENTION OSTOMY MORE PINK TODAY LESS CONGESTION  MINIMAL FLUID IN BAG NO PERITONITIS.   Lab Results:   Recent Labs  10/24/13 0525 10/24/13 0710  WBC 6.4 7.7  HGB 9.2* 10.7*  HCT 29.2* 33.3*  PLT 360 388   BMET  Recent Labs  10/23/13 0600 10/24/13 0710  NA 140 139  K 5.0 5.1  CL 109 109  CO2 21 21  GLUCOSE 77 80  BUN 34* 24*  CREATININE 1.14 1.08  CALCIUM 8.0* 8.2*   PT/INR No results found for this basename: LABPROT, INR,  in the last 72 hours ABG No results found for this basename: PHART, PCO2, PO2, HCO3,  in the last 72 hours  Studies/Results: No results found.  Anti-infectives: Anti-infectives   Start     Dose/Rate Route Frequency Ordered Stop   10/22/13 1000  [MAR Hold]  cefoTEtan (CEFOTAN) 2 g in dextrose 5 % 50 mL IVPB     (On MAR Hold since 10/22/13 1045)   2 g 100 mL/hr over 30 Minutes Intravenous On call to O.R. 10/22/13 0915 10/22/13 1244   10/22/13 0600  cefOXitin (MEFOXIN) 2 g in dextrose 5 % 50 mL IVPB  Status:  Discontinued     2 g 100 mL/hr over 30 Minutes Intravenous On call to O.R. 10/21/13 1501 10/21/13 1620   10/21/13 1530  rifaximin (XIFAXAN) tablet 550 mg     550 mg Oral 2 times daily 10/21/13 1501        Assessment/Plan: s/p Procedure(s): END COLOSTOMY (N/A) DIAGNOSTIC LAPAROSCOPY CONVERTED TO OPEN (N/A) LYSIS OF ADHESIONS (N/A) EXPLORATORY LAPAROTOMY (N/A) COLOSTOMY LOOKS BETTER BUT  HAS SOME DISTENTION.   CAN RETURN TO [facility] NEXT WEEK  LOS: 4 days    Clayton Gross A. 10/25/2013

## 2013-10-25 NOTE — Progress Notes (Signed)
Pt had a regular BM which was brown and semiformed from his rectum.  He had 500 cc of greenish liquid from his colostomy and the bag had leaked so changed the pouch.  Dressing changed to the midline staple incision.  Urostomy intact to the right side.  Dressing change done to the buttock with xeroform and ABDs.

## 2013-10-26 MED ORDER — MORPHINE SULFATE 2 MG/ML IJ SOLN: 1.0000 mg | Freq: Four times a day (QID) | INTRAMUSCULAR | Status: DC | PRN

## 2013-10-26 MED ORDER — GLUCERNA SHAKE PO LIQD: 237.0000 mL | Freq: Two times a day (BID) | ORAL | Status: DC

## 2013-10-26 MED ORDER — PRO-STAT SUGAR FREE PO LIQD
30.0000 mL | Freq: Two times a day (BID) | ORAL | Status: DC
  Filled 2013-10-26 (×2): qty 30

## 2013-10-26 MED ORDER — ADULT MULTIVITAMIN W/MINERALS CH
1.0000 | ORAL_TABLET | Freq: Every day | ORAL | Status: DC
  Administered 2013-10-26 – 2013-10-27 (×2): 1 via ORAL
  Filled 2013-10-26: qty 1

## 2013-10-26 NOTE — Progress Notes (Signed)
Agree with above plan [facility] tomorrow

## 2013-10-26 NOTE — Progress Notes (Signed)
[facility] Note  4 Days Post-Op  Subjective: Pt doing well, tolerating dressing changes to sacral decubs and midline wound.  Tolerating diet, but doesn't like the full liquids.  Wants regular food.  Notes BM's from rectum yesterday and this am.  He says foley came loose as well and he needs cleaned up.    Objective: Vital signs in last 24 hours: Temp:  [98.1 F (36.7 C)-98.5 F (36.9 C)] 98.1 F (36.7 C) (08/10 0446) Pulse Rate:  [68-89] 76 (08/10 0446) Resp:  [16-17] 16 (08/10 0446) BP: (96-102)/(61-66) 96/64 mmHg (08/10 0446) SpO2:  [95 %-96 %] 96 % (08/10 0446) Last BM Date: 10/25/13  Intake/Output from previous day: 08/09 0701 - 08/10 0700 In: 1780 [P.O.:680; I.V.:1100] Out: 2900 [Urine:2400; Stool:500] Intake/Output this shift:    PE: Gen:  Alert, NAD, pleasant Abd: Soft, mild distension, minimally tender, +BS, no HSM, incisions clean with some erythema around the staples, no induration or fluctuance at incision   Lab Results:   Recent Labs  10/24/13 0525 10/24/13 0710  WBC 6.4 7.7  HGB 9.2* 10.7*  HCT 29.2* 33.3*  PLT 360 388   BMET  Recent Labs  10/24/13 0710  NA 139  K 5.1  CL 109  CO2 21  GLUCOSE 80  BUN 24*  CREATININE 1.08  CALCIUM 8.2*   PT/INR No results found for this basename: LABPROT, INR,  in the last 72 hours CMP     Component Value Date/Time   NA 139 10/24/2013 0710   K 5.1 10/24/2013 0710   CL 109 10/24/2013 0710   CO2 21 10/24/2013 0710   GLUCOSE 80 10/24/2013 0710   BUN 24* 10/24/2013 0710   CREATININE 1.08 10/24/2013 0710   CALCIUM 8.2* 10/24/2013 0710   PROT 6.0 10/24/2013 0710   ALBUMIN 2.0* 10/24/2013 0710   AST 23 10/24/2013 0710   ALT 48 10/24/2013 0710   ALKPHOS 84 10/24/2013 0710   BILITOT 0.3 10/24/2013 0710   GFRNONAA 77* 10/24/2013 0710   GFRAA 89* 10/24/2013 0710   Lipase  No results found for this basename: lipase       Studies/Results: No results found.  Anti-infectives: Anti-infectives   Start      Dose/Rate Route Frequency Ordered Stop   10/22/13 1000  [MAR Hold]  cefoTEtan (CEFOTAN) 2 g in dextrose 5 % 50 mL IVPB     (On MAR Hold since 10/22/13 1045)   2 g 100 mL/hr over 30 Minutes Intravenous On call to O.R. 10/22/13 0915 10/22/13 1244   10/22/13 0600  cefOXitin (MEFOXIN) 2 g in dextrose 5 % 50 mL IVPB  Status:  Discontinued     2 g 100 mL/hr over 30 Minutes Intravenous On call to O.R. 10/21/13 1501 10/21/13 1620   10/21/13 1530  rifaximin (XIFAXAN) tablet 550 mg     550 mg Oral 2 times daily 10/21/13 1501         Assessment/Plan Massive sacral decubitus ulcer Need for diverting colostomy POD #4 s/p diagnostic laparoscopy converted to open Ex Lap, LOA, end colostomy H/o colostomy with colostomy takedown Right AKA, Left MT amputation Hypothyroidism  Paraplegia  Ileoconduit  Liver cirrhosis  ABL anemia-s/p 2units of PRBCs, stable as of 10/24/13 Hypotension-BP runs low, but better, asymptomatic. Red IVF and monitor.    Plan: 1.  [facility] as [facility] as today 2.  [facility], pain control, antiemetics 3.  Wound care WD dressings to midline wound BID,  and dressing changes to sacral decub 4.  WOC nursing for ostomy care/teaching 5.  Activity as tolerated, IS 6.  SCD's and lovenox 7.  Had a massive BM from rectum (must be residual stool in the colon before colostomy)    LOS: 5 days    DORT, Lynnox Girten 10/26/2013, 9:03 AM Pager: [phone]
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
## 2013-10-27 ENCOUNTER — Inpatient Hospital Stay
Admission: AD | Admit: 2013-10-27 | Discharge: 2013-12-15 | Disposition: A | Discharge status: Skilled Nursing Facility | Payer: Self-pay | Source: Ambulatory Visit | Attending: Internal Medicine | Admitting: Internal Medicine

## 2013-10-27 DIAGNOSIS — Z933 Colostomy status: Secondary | ICD-10-CM

## 2013-10-27 DIAGNOSIS — L89154 Pressure ulcer of sacral region, stage 4: Secondary | ICD-10-CM

## 2013-10-27 MED ORDER — FLEET ENEMA 7-19 GM/118ML RE ENEM
1.0000 | ENEMA | Freq: Once | RECTAL | Status: AC
  Administered 2013-10-27: 1 via RECTAL
  Filled 2013-10-27: qty 1

## 2013-10-27 MED ORDER — OXYCODONE HCL 5 MG PO TABS: 5.0000 mg | ORAL_TABLET | ORAL | Status: DC | PRN

## 2013-10-27 MED ORDER — FLEET ENEMA 7-19 GM/118ML RE ENEM: 1.0000 | ENEMA | Freq: Every day | RECTAL | Status: DC | PRN

## 2013-10-27 NOTE — Consult Note (Addendum)
WOC follow-up:  CCS team following for assessment and plan of care to sacral wound. Urostomy convex pouching system intact to RLQ with good seal, mod amt urine in bedside drainage bag. Supplies at bedside for staff nurse use. Pt declines offer to change urostomy pouch today; states he usually gets 7 days or wear time for his pouch. Colostomy stoma to LUQ. Red and viable, above skin level, 1 3/4 inches.  Mod amt liquid stool in the pouch. Pt states he is familiar with pouch application and emptying since he currently is independent with urosotmy pouching and he had a colostomy as a teenager.  Demonstrated pouch application using 2 piece system.  Pt is able to open and close velcro to empty. Supplies at bedside for staff nurse use until patient feels strong enough to be independent with pouching activities. He states is is familiar with ordering supplies when at home and uses [employer] products. Denies further questions at this time. Plans to transfer to [facility] where he will have further assistance with pouching activities. Cammie Mcgeeawn Orhan Mayorga MSN, RN, CWOCN, [place], CNS  [phone]

## 2013-10-27 NOTE — Anesthesia Postprocedure Evaluation (Signed)
Anesthesia Post Note  Patient: Clayton Gross  Procedure(s) Performed: Procedure(s) (LRB): END COLOSTOMY (N/A) DIAGNOSTIC LAPAROSCOPY CONVERTED TO OPEN (N/A) LYSIS OF ADHESIONS (N/A) EXPLORATORY LAPAROTOMY (N/A)  Anesthesia type: General  Patient location: PACU  Post pain: Pain level controlled and Adequate analgesia  Post assessment: Post-op Vital signs reviewed, Patient's Cardiovascular Status Stable, Respiratory Function Stable, Patent Airway and Pain level controlled  Last Vitals:  Filed Vitals:   10/27/13 0444  BP: 96/57  Pulse: 67  Temp: 36.9 C  Resp: 15    Post vital signs: Reviewed and stable  Level of consciousness: awake, alert  and oriented  Complications: No apparent anesthesia complications

## 2013-10-27 NOTE — Progress Notes (Signed)
D/c via hospital bed to [facility] by [facility] RN's to [id number].  Pt understood transfer order and instructions. Report call and EMTALA form complete.  All written instructions given to RN at [facility].

## 2013-10-27 NOTE — Discharge Instructions (Signed)
CCS      [facility] [phone]  OPEN ABDOMINAL SURGERY: POST OP INSTRUCTIONS  Always review your discharge instruction sheet given to you by the facility where your surgery was performed.  IF YOU HAVE DISABILITY OR FAMILY LEAVE FORMS, YOU MUST BRING THEM TO THE OFFICE FOR PROCESSING.  PLEASE DO NOT GIVE THEM TO YOUR DOCTOR.  1. A prescription for pain medication may be given to you upon discharge.  Take your pain medication as prescribed, if needed.  If narcotic pain medicine is not needed, then you may take acetaminophen (Tylenol) or ibuprofen (Advil) as needed. 2. Take your usually prescribed medications unless otherwise directed. 3. If you need a refill on your pain medication, please contact your pharmacy. They will contact our office to request authorization.  Prescriptions will not be filled after 5pm or on week-ends. 4. You should follow a light diet the first few days after arrival home, such as soup and crackers, pudding, etc.unless your doctor has advised otherwise. A high-fiber, low fat diet can be resumed as tolerated.   Be sure to include lots of fluids daily. Most patients will experience some swelling and bruising on the chest and neck area.  Ice packs will help.  Swelling and bruising can take several days to resolve 5. Most patients will experience some swelling and bruising in the area of the incision. Ice pack will help. Swelling and bruising can take several days to resolve..  6. It is common to experience some constipation if taking pain medication after surgery.  Increasing fluid intake and taking a stool softener will usually help or prevent this problem from occurring.  A mild laxative (Milk of Magnesia or Miralax) should be taken according to package directions if there are no bowel movements after 48 hours. 7.  You may have steri-strips (small skin tapes) in place directly over the incision.  These strips should be left on the skin for 7-10 days.  If your  surgeon used skin glue on the incision, you may shower in 24 hours.  The glue will flake off over the next 2-3 weeks.  Any sutures or staples will be removed at the office during your follow-up visit. You may find that a light gauze bandage over your incision may keep your staples from being rubbed or pulled. You may shower and replace the bandage daily. 8. ACTIVITIES:  You may resume regular (light) daily activities beginning the next day--such as daily self-care, walking, climbing stairs--gradually increasing activities as tolerated.  You may have sexual intercourse when it is comfortable.  Refrain from any heavy lifting or straining until approved by your doctor. a. You may drive when you no longer are taking prescription pain medication, you can comfortably wear a seatbelt, and you can safely maneuver your car and apply brakes b. Return to Work: ___________________________________ 9. You should see your doctor in the office for a follow-up appointment approximately two weeks after your surgery.  Make sure that you call for this appointment within a day or two after you arrive home to insure a convenient appointment time. OTHER INSTRUCTIONS:  _____________________________________________________________ _____________________________________________________________  WHEN TO CALL YOUR DOCTOR: 1. Fever over 101.0 2. Inability to urinate 3. Nausea and/or vomiting 4. Extreme swelling or bruising 5. Continued bleeding from incision. 6. Increased pain, redness, or drainage from the incision. 7. Difficulty swallowing or breathing 8. Muscle cramping or spasms. 9. Numbness or tingling in hands or feet or around lips.  The clinic staff is available to   answer your questions during regular business hours.  Please don't hesitate to call and ask to speak to one of the nurses if you have concerns.  For further questions, please visit [email or web address]   

## 2013-10-27 NOTE — Progress Notes (Addendum)
[facility] Progress Note  5 Days Post-Op  Subjective: Pt doing well, tolerating diet, no N/V.  Having good ostomy output.  Still having stools from colon stump.    Objective: Vital signs in last 24 hours: Temp:  [97.9 F (36.6 C)-98.4 F (36.9 C)] 98.4 F (36.9 C) (08/11 0444) Pulse Rate:  [65-69] 67 (08/11 0444) Resp:  [15-16] 15 (08/11 0444) BP: (93-100)/(57-62) 96/57 mmHg (08/11 0444) SpO2:  [95 %-99 %] 99 % (08/11 0444) Last BM Date: 10/26/13  Intake/Output from previous day: 08/10 0701 - 08/11 0700 In: 900 [P.O.:480; I.V.:420] Out: 2850 [Urine:2050; Stool:800] Intake/Output this shift:    PE: Gen:  Alert, NAD, pleasant Abd: Soft, mild distension, minimally tender, +BS, no HSM, incisions clean with some erythema around the staples (appears this is from his previous scar, no induration or fluctuance at incision    Lab Results:  No results found for this basename: WBC, HGB, HCT, PLT,  in the last 72 hours BMET No results found for this basename: NA, K, CL, CO2, GLUCOSE, BUN, CREATININE, CALCIUM,  in the last 72 hours PT/INR No results found for this basename: LABPROT, INR,  in the last 72 hours CMP     Component Value Date/Time   NA 139 10/24/2013 0710   K 5.1 10/24/2013 0710   CL 109 10/24/2013 0710   CO2 21 10/24/2013 0710   GLUCOSE 80 10/24/2013 0710   BUN 24* 10/24/2013 0710   CREATININE 1.08 10/24/2013 0710   CALCIUM 8.2* 10/24/2013 0710   PROT 6.0 10/24/2013 0710   ALBUMIN 2.0* 10/24/2013 0710   AST 23 10/24/2013 0710   ALT 48 10/24/2013 0710   ALKPHOS 84 10/24/2013 0710   BILITOT 0.3 10/24/2013 0710   GFRNONAA 77* 10/24/2013 0710   GFRAA 89* 10/24/2013 0710   Lipase  No results found for this basename: lipase       Studies/Results: No results found.  Anti-infectives: Anti-infectives   Start     Dose/Rate Route Frequency Ordered Stop   10/22/13 1000  [MAR Hold]  cefoTEtan (CEFOTAN) 2 g in dextrose 5 % 50 mL IVPB     (On MAR Hold since 10/22/13 1045)   2  g 100 mL/hr over 30 Minutes Intravenous On call to O.R. 10/22/13 0915 10/22/13 1244   10/22/13 0600  cefOXitin (MEFOXIN) 2 g in dextrose 5 % 50 mL IVPB  Status:  Discontinued     2 g 100 mL/hr over 30 Minutes Intravenous On call to O.R. 10/21/13 1501 10/21/13 1620   10/21/13 1530  rifaximin (XIFAXAN) tablet 550 mg     550 mg Oral 2 times daily 10/21/13 1501         Assessment/Plan Massive sacral decubitus ulcer  Need for diverting colostomy POD #5 s/p diagnostic laparoscopy converted to open Ex Lap, LOA, end colostomy  H/o colostomy with colostomy takedown  Right AKA, Left MT amputation  Hypothyroidism  Paraplegia  Ileoconduit  Liver cirrhosis  ABL anemia-s/p 2units of PRBCs, stable as of 10/24/13  Hypotension-BP runs low, but better, asymptomatic. Red IVF and monitor.   Plan:  1. Can return to [facility] today 2. Tolerating HH diet, IVF, pain control, antiemetics  3. Discontinue midline wound care, and continue BID dressing changes to sacral decub  4. WOC nursing for ostomy care/teaching  5. Activity as tolerated, IS  6. SCD's and lovenox  7. Will give enema today to help eliminate residual stool in transverse, descending, and sigmoid colon 8. Staples to be removed  POD #10     LOS: 6 days    DORT, MEGAN 10/27/2013, 7:45 AM Pager: [phone]

## 2013-10-27 NOTE — Progress Notes (Signed)
Agree with above 

## 2013-10-27 NOTE — Progress Notes (Signed)
PT Cancellation Note  Patient Details Name: Clayton Gross MRN: [id number] DOB: 1961-03-05   Cancelled Treatment:    Reason Eval/Treat Not Completed:  (pt leaving for [facility].)   Marcene BrawnChadwell, Cassie Henkels Marie 10/27/2013, 11:44 AM  Lewis ShockAshly Keymon Mcelroy, PT, DPT Pager #: [phone] Office #: [phone]
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
## 2013-10-28 LAB — CBC WITH DIFFERENTIAL/PLATELET
Basophils Absolute: 0.1 10*3/uL (ref 0.0–0.1)
Basophils Relative: 1 % (ref 0–1)
EOS ABS: 1.1 10*3/uL — AB (ref 0.0–0.7)
EOS PCT: 9 % — AB (ref 0–5)
HCT: 37.6 % — ABNORMAL LOW (ref 39.0–52.0)
HEMOGLOBIN: 11.8 g/dL — AB (ref 13.0–17.0)
LYMPHS ABS: 1.8 10*3/uL (ref 0.7–4.0)
Lymphocytes Relative: 15 % (ref 12–46)
MCH: 30.4 pg (ref 26.0–34.0)
MCHC: 31.4 g/dL (ref 30.0–36.0)
MCV: 96.9 fL (ref 78.0–100.0)
MONO ABS: 1.5 10*3/uL — AB (ref 0.1–1.0)
MONOS PCT: 12 % (ref 3–12)
Neutro Abs: 7.9 10*3/uL — ABNORMAL HIGH (ref 1.7–7.7)
Neutrophils Relative %: 63 % (ref 43–77)
PLATELETS: 336 10*3/uL (ref 150–400)
RBC: 3.88 MIL/uL — AB (ref 4.22–5.81)
RDW: 15.7 % — ABNORMAL HIGH (ref 11.5–15.5)
WBC: 12.5 10*3/uL — ABNORMAL HIGH (ref 4.0–10.5)

## 2013-10-28 LAB — COMPREHENSIVE METABOLIC PANEL
ALT: 23 U/L (ref 0–53)
ANION GAP: 10 (ref 5–15)
AST: 20 U/L (ref 0–37)
Albumin: 1.9 g/dL — ABNORMAL LOW (ref 3.5–5.2)
Alkaline Phosphatase: 83 U/L (ref 39–117)
BUN: 20 mg/dL (ref 6–23)
CALCIUM: 8.3 mg/dL — AB (ref 8.4–10.5)
CO2: 21 mEq/L (ref 19–32)
CREATININE: 1.25 mg/dL (ref 0.50–1.35)
Chloride: 106 mEq/L (ref 96–112)
GFR calc non Af Amer: 64 mL/min — ABNORMAL LOW (ref >90)
GFR, EST AFRICAN AMERICAN: 74 mL/min — AB (ref >90)
GLUCOSE: 67 mg/dL — AB (ref 70–99)
Potassium: 4.9 mEq/L (ref 3.7–5.3)
Sodium: 137 mEq/L (ref 137–147)
Total Bilirubin: <0.2 mg/dL — ABNORMAL LOW (ref 0.3–1.2)
Total Protein: 6.3 g/dL (ref 6.0–8.3)

## 2013-10-28 LAB — PREALBUMIN: Prealbumin: 10.4 mg/dL — ABNORMAL LOW (ref 17.0–34.0)

## 2013-10-30 LAB — CBC
HCT: 37.5 % — ABNORMAL LOW (ref 39.0–52.0)
Hemoglobin: 11.7 g/dL — ABNORMAL LOW (ref 13.0–17.0)
MCH: 30.1 pg (ref 26.0–34.0)
MCHC: 31.2 g/dL (ref 30.0–36.0)
MCV: 96.4 fL (ref 78.0–100.0)
PLATELETS: 285 10*3/uL (ref 150–400)
RBC: 3.89 MIL/uL — ABNORMAL LOW (ref 4.22–5.81)
RDW: 15.2 % (ref 11.5–15.5)
WBC: 13.3 10*3/uL — AB (ref 4.0–10.5)

## 2013-10-30 LAB — BASIC METABOLIC PANEL
Anion gap: 13 (ref 5–15)
BUN: 44 mg/dL — ABNORMAL HIGH (ref 6–23)
CHLORIDE: 107 meq/L (ref 96–112)
CO2: 20 meq/L (ref 19–32)
Calcium: 8.7 mg/dL (ref 8.4–10.5)
Creatinine, Ser: 1.35 mg/dL (ref 0.50–1.35)
GFR calc non Af Amer: 58 mL/min — ABNORMAL LOW (ref >90)
GFR, EST AFRICAN AMERICAN: 68 mL/min — AB (ref >90)
Glucose, Bld: 86 mg/dL (ref 70–99)
Potassium: 4.5 mEq/L (ref 3.7–5.3)
SODIUM: 140 meq/L (ref 137–147)

## 2013-10-30 LAB — TSH: TSH: 2.77 u[IU]/mL (ref 0.350–4.500)

## 2013-10-31 LAB — BASIC METABOLIC PANEL
Anion gap: 11 (ref 5–15)
BUN: 51 mg/dL — AB (ref 6–23)
CALCIUM: 9 mg/dL (ref 8.4–10.5)
CO2: 19 mEq/L (ref 19–32)
Chloride: 110 mEq/L (ref 96–112)
Creatinine, Ser: 1.43 mg/dL — ABNORMAL HIGH (ref 0.50–1.35)
GFR calc non Af Amer: 55 mL/min — ABNORMAL LOW (ref >90)
GFR, EST AFRICAN AMERICAN: 63 mL/min — AB (ref >90)
Glucose, Bld: 74 mg/dL (ref 70–99)
POTASSIUM: 4.2 meq/L (ref 3.7–5.3)
Sodium: 140 mEq/L (ref 137–147)

## 2013-10-31 LAB — DIGOXIN LEVEL: Digoxin Level: 1.2 ng/mL (ref 0.8–2.0)

## 2013-11-01 LAB — BASIC METABOLIC PANEL
Anion gap: 13 (ref 5–15)
BUN: 50 mg/dL — AB (ref 6–23)
CALCIUM: 8.7 mg/dL (ref 8.4–10.5)
CO2: 16 mEq/L — ABNORMAL LOW (ref 19–32)
CREATININE: 1.21 mg/dL (ref 0.50–1.35)
Chloride: 111 mEq/L (ref 96–112)
GFR, EST AFRICAN AMERICAN: 77 mL/min — AB (ref >90)
GFR, EST NON AFRICAN AMERICAN: 67 mL/min — AB (ref >90)
Glucose, Bld: 133 mg/dL — ABNORMAL HIGH (ref 70–99)
POTASSIUM: 4.7 meq/L (ref 3.7–5.3)
Sodium: 140 mEq/L (ref 137–147)

## 2013-11-02 LAB — CBC WITH DIFFERENTIAL/PLATELET
BASOS ABS: 0.1 10*3/uL (ref 0.0–0.1)
Basophils Relative: 1 % (ref 0–1)
EOS ABS: 0.6 10*3/uL (ref 0.0–0.7)
EOS PCT: 5 % (ref 0–5)
HCT: 37.5 % — ABNORMAL LOW (ref 39.0–52.0)
Hemoglobin: 11.9 g/dL — ABNORMAL LOW (ref 13.0–17.0)
Lymphocytes Relative: 15 % (ref 12–46)
Lymphs Abs: 1.9 10*3/uL (ref 0.7–4.0)
MCH: 30.2 pg (ref 26.0–34.0)
MCHC: 31.7 g/dL (ref 30.0–36.0)
MCV: 95.2 fL (ref 78.0–100.0)
Monocytes Absolute: 1.5 10*3/uL — ABNORMAL HIGH (ref 0.1–1.0)
Monocytes Relative: 12 % (ref 3–12)
Neutro Abs: 8.4 10*3/uL — ABNORMAL HIGH (ref 1.7–7.7)
Neutrophils Relative %: 67 % (ref 43–77)
PLATELETS: 265 10*3/uL (ref 150–400)
RBC: 3.94 MIL/uL — AB (ref 4.22–5.81)
RDW: 14.8 % (ref 11.5–15.5)
WBC: 12.4 10*3/uL — ABNORMAL HIGH (ref 4.0–10.5)

## 2013-11-02 LAB — COMPREHENSIVE METABOLIC PANEL
ALT: 133 U/L — ABNORMAL HIGH (ref 0–53)
AST: 81 U/L — ABNORMAL HIGH (ref 0–37)
Albumin: 2.1 g/dL — ABNORMAL LOW (ref 3.5–5.2)
Alkaline Phosphatase: 99 U/L (ref 39–117)
Anion gap: 13 (ref 5–15)
BUN: 49 mg/dL — ABNORMAL HIGH (ref 6–23)
CALCIUM: 9.4 mg/dL (ref 8.4–10.5)
CO2: 17 mEq/L — ABNORMAL LOW (ref 19–32)
Chloride: 111 mEq/L (ref 96–112)
Creatinine, Ser: 1.24 mg/dL (ref 0.50–1.35)
GFR calc non Af Amer: 65 mL/min — ABNORMAL LOW (ref >90)
GFR, EST AFRICAN AMERICAN: 75 mL/min — AB (ref >90)
Glucose, Bld: 76 mg/dL (ref 70–99)
Potassium: 4.9 mEq/L (ref 3.7–5.3)
Sodium: 141 mEq/L (ref 137–147)
TOTAL PROTEIN: 7.1 g/dL (ref 6.0–8.3)
Total Bilirubin: <0.2 mg/dL — ABNORMAL LOW (ref 0.3–1.2)

## 2013-11-02 LAB — PREALBUMIN: Prealbumin: 15.6 mg/dL — ABNORMAL LOW (ref 17.0–34.0)

## 2013-11-04 LAB — CBC
HCT: 37 % — ABNORMAL LOW (ref 39.0–52.0)
HEMOGLOBIN: 11.7 g/dL — AB (ref 13.0–17.0)
MCH: 30.1 pg (ref 26.0–34.0)
MCHC: 31.6 g/dL (ref 30.0–36.0)
MCV: 95.1 fL (ref 78.0–100.0)
PLATELETS: 288 10*3/uL (ref 150–400)
RBC: 3.89 MIL/uL — AB (ref 4.22–5.81)
RDW: 15 % (ref 11.5–15.5)
WBC: 14.1 10*3/uL — AB (ref 4.0–10.5)

## 2013-11-04 LAB — BASIC METABOLIC PANEL
Anion gap: 13 (ref 5–15)
BUN: 69 mg/dL — ABNORMAL HIGH (ref 6–23)
CALCIUM: 9.7 mg/dL (ref 8.4–10.5)
CHLORIDE: 110 meq/L (ref 96–112)
CO2: 16 meq/L — AB (ref 19–32)
Creatinine, Ser: 1.49 mg/dL — ABNORMAL HIGH (ref 0.50–1.35)
GFR calc Af Amer: 60 mL/min — ABNORMAL LOW (ref >90)
GFR calc non Af Amer: 52 mL/min — ABNORMAL LOW (ref >90)
GLUCOSE: 133 mg/dL — AB (ref 70–99)
Potassium: 4.8 mEq/L (ref 3.7–5.3)
SODIUM: 139 meq/L (ref 137–147)

## 2013-11-04 LAB — PHOSPHORUS: Phosphorus: 4.4 mg/dL (ref 2.3–4.6)

## 2013-11-04 LAB — MAGNESIUM: MAGNESIUM: 1.8 mg/dL (ref 1.5–2.5)

## 2013-11-05 LAB — CBC
HEMATOCRIT: 32.6 % — AB (ref 39.0–52.0)
HEMOGLOBIN: 10.5 g/dL — AB (ref 13.0–17.0)
MCH: 29.8 pg (ref 26.0–34.0)
MCHC: 32.2 g/dL (ref 30.0–36.0)
MCV: 92.6 fL (ref 78.0–100.0)
Platelets: 305 10*3/uL (ref 150–400)
RBC: 3.52 MIL/uL — AB (ref 4.22–5.81)
RDW: 15.2 % (ref 11.5–15.5)
WBC: 15.3 10*3/uL — ABNORMAL HIGH (ref 4.0–10.5)

## 2013-11-05 LAB — URINE MICROSCOPIC-ADD ON

## 2013-11-05 LAB — PHOSPHORUS: Phosphorus: 4.3 mg/dL (ref 2.3–4.6)

## 2013-11-05 LAB — C-REACTIVE PROTEIN: CRP: 10.4 mg/dL — ABNORMAL HIGH (ref <0.60)

## 2013-11-05 LAB — SEDIMENTATION RATE: Sed Rate: 127 mm/hr — ABNORMAL HIGH (ref 0–16)

## 2013-11-05 LAB — BASIC METABOLIC PANEL
Anion gap: 13 (ref 5–15)
BUN: 67 mg/dL — AB (ref 6–23)
CALCIUM: 9.4 mg/dL (ref 8.4–10.5)
CO2: 15 meq/L — AB (ref 19–32)
CREATININE: 1.29 mg/dL (ref 0.50–1.35)
Chloride: 112 mEq/L (ref 96–112)
GFR calc Af Amer: 72 mL/min — ABNORMAL LOW (ref >90)
GFR calc non Af Amer: 62 mL/min — ABNORMAL LOW (ref >90)
GLUCOSE: 98 mg/dL (ref 70–99)
Potassium: 4.2 mEq/L (ref 3.7–5.3)
SODIUM: 140 meq/L (ref 137–147)

## 2013-11-05 LAB — URINALYSIS, ROUTINE W REFLEX MICROSCOPIC
Bilirubin Urine: NEGATIVE
Glucose, UA: 100 mg/dL — AB
Ketones, ur: NEGATIVE mg/dL
NITRITE: POSITIVE — AB
Protein, ur: 30 mg/dL — AB
SPECIFIC GRAVITY, URINE: 1.014 (ref 1.005–1.030)
Urobilinogen, UA: 0.2 mg/dL (ref 0.0–1.0)
pH: 6.5 (ref 5.0–8.0)

## 2013-11-05 LAB — MAGNESIUM: MAGNESIUM: 1.7 mg/dL (ref 1.5–2.5)

## 2013-11-06 LAB — URINALYSIS, ROUTINE W REFLEX MICROSCOPIC
BILIRUBIN URINE: NEGATIVE
Glucose, UA: NEGATIVE mg/dL
Ketones, ur: NEGATIVE mg/dL
NITRITE: POSITIVE — AB
PH: 6 (ref 5.0–8.0)
Protein, ur: 30 mg/dL — AB
SPECIFIC GRAVITY, URINE: 1.011 (ref 1.005–1.030)
Urobilinogen, UA: 0.2 mg/dL (ref 0.0–1.0)

## 2013-11-06 LAB — CBC WITH DIFFERENTIAL/PLATELET
Basophils Absolute: 0.1 10*3/uL (ref 0.0–0.1)
Basophils Relative: 1 % (ref 0–1)
Eosinophils Absolute: 0.8 10*3/uL — ABNORMAL HIGH (ref 0.0–0.7)
Eosinophils Relative: 5 % (ref 0–5)
HCT: 33.9 % — ABNORMAL LOW (ref 39.0–52.0)
Hemoglobin: 10.7 g/dL — ABNORMAL LOW (ref 13.0–17.0)
LYMPHS PCT: 8 % — AB (ref 12–46)
Lymphs Abs: 1.3 10*3/uL (ref 0.7–4.0)
MCH: 29.9 pg (ref 26.0–34.0)
MCHC: 31.6 g/dL (ref 30.0–36.0)
MCV: 94.7 fL (ref 78.0–100.0)
Monocytes Absolute: 1.1 10*3/uL — ABNORMAL HIGH (ref 0.1–1.0)
Monocytes Relative: 6 % (ref 3–12)
NEUTROS PCT: 80 % — AB (ref 43–77)
Neutro Abs: 13.4 10*3/uL — ABNORMAL HIGH (ref 1.7–7.7)
Platelets: 309 10*3/uL (ref 150–400)
RBC: 3.58 MIL/uL — ABNORMAL LOW (ref 4.22–5.81)
RDW: 15.2 % (ref 11.5–15.5)
WBC: 16.7 10*3/uL — ABNORMAL HIGH (ref 4.0–10.5)

## 2013-11-06 LAB — URINE CULTURE

## 2013-11-06 LAB — COMPREHENSIVE METABOLIC PANEL
ALT: 159 U/L — AB (ref 0–53)
AST: 62 U/L — AB (ref 0–37)
Albumin: 2.1 g/dL — ABNORMAL LOW (ref 3.5–5.2)
Alkaline Phosphatase: 119 U/L — ABNORMAL HIGH (ref 39–117)
Anion gap: 12 (ref 5–15)
BUN: 55 mg/dL — ABNORMAL HIGH (ref 6–23)
CO2: 14 meq/L — AB (ref 19–32)
Calcium: 9.1 mg/dL (ref 8.4–10.5)
Chloride: 112 mEq/L (ref 96–112)
Creatinine, Ser: 1.27 mg/dL (ref 0.50–1.35)
GFR calc Af Amer: 73 mL/min — ABNORMAL LOW (ref >90)
GFR, EST NON AFRICAN AMERICAN: 63 mL/min — AB (ref >90)
Glucose, Bld: 121 mg/dL — ABNORMAL HIGH (ref 70–99)
Potassium: 4 mEq/L (ref 3.7–5.3)
SODIUM: 138 meq/L (ref 137–147)
Total Bilirubin: <0.2 mg/dL — ABNORMAL LOW (ref 0.3–1.2)
Total Protein: 6.9 g/dL (ref 6.0–8.3)

## 2013-11-06 LAB — URINE MICROSCOPIC-ADD ON

## 2013-11-06 LAB — PHOSPHORUS: PHOSPHORUS: 3.6 mg/dL (ref 2.3–4.6)

## 2013-11-06 LAB — MAGNESIUM: Magnesium: 1.6 mg/dL (ref 1.5–2.5)

## 2013-11-07 LAB — BASIC METABOLIC PANEL
Anion gap: 14 (ref 5–15)
BUN: 56 mg/dL — ABNORMAL HIGH (ref 6–23)
CO2: 14 mEq/L — ABNORMAL LOW (ref 19–32)
CREATININE: 1.47 mg/dL — AB (ref 0.50–1.35)
Calcium: 9.6 mg/dL (ref 8.4–10.5)
Chloride: 108 mEq/L (ref 96–112)
GFR calc Af Amer: 61 mL/min — ABNORMAL LOW (ref >90)
GFR, EST NON AFRICAN AMERICAN: 53 mL/min — AB (ref >90)
Glucose, Bld: 108 mg/dL — ABNORMAL HIGH (ref 70–99)
Potassium: 4.3 mEq/L (ref 3.7–5.3)
Sodium: 136 mEq/L — ABNORMAL LOW (ref 137–147)

## 2013-11-07 LAB — URINE CULTURE

## 2013-11-07 LAB — CBC
HCT: 32.5 % — ABNORMAL LOW (ref 39.0–52.0)
HEMOGLOBIN: 10.7 g/dL — AB (ref 13.0–17.0)
MCH: 30.1 pg (ref 26.0–34.0)
MCHC: 32.9 g/dL (ref 30.0–36.0)
MCV: 91.3 fL (ref 78.0–100.0)
Platelets: 279 10*3/uL (ref 150–400)
RBC: 3.56 MIL/uL — ABNORMAL LOW (ref 4.22–5.81)
RDW: 15.4 % (ref 11.5–15.5)
WBC: 13.9 10*3/uL — ABNORMAL HIGH (ref 4.0–10.5)

## 2013-11-07 LAB — CLOSTRIDIUM DIFFICILE BY PCR: Toxigenic C. Difficile by PCR: NEGATIVE

## 2013-11-08 ENCOUNTER — Other Ambulatory Visit (HOSPITAL_COMMUNITY): Payer: Self-pay

## 2013-11-08 LAB — BASIC METABOLIC PANEL
Anion gap: 12 (ref 5–15)
BUN: 57 mg/dL — ABNORMAL HIGH (ref 6–23)
CO2: 13 meq/L — AB (ref 19–32)
Calcium: 8.5 mg/dL (ref 8.4–10.5)
Chloride: 106 mEq/L (ref 96–112)
Creatinine, Ser: 1.23 mg/dL (ref 0.50–1.35)
GFR calc Af Amer: 76 mL/min — ABNORMAL LOW (ref >90)
GFR, EST NON AFRICAN AMERICAN: 65 mL/min — AB (ref >90)
GLUCOSE: 171 mg/dL — AB (ref 70–99)
POTASSIUM: 4.2 meq/L (ref 3.7–5.3)
Sodium: 131 mEq/L — ABNORMAL LOW (ref 137–147)

## 2013-11-08 LAB — CBC
HCT: 28.6 % — ABNORMAL LOW (ref 39.0–52.0)
HEMOGLOBIN: 9.3 g/dL — AB (ref 13.0–17.0)
MCH: 30.1 pg (ref 26.0–34.0)
MCHC: 32.5 g/dL (ref 30.0–36.0)
MCV: 92.6 fL (ref 78.0–100.0)
PLATELETS: 194 10*3/uL (ref 150–400)
RBC: 3.09 MIL/uL — ABNORMAL LOW (ref 4.22–5.81)
RDW: 15.3 % (ref 11.5–15.5)
WBC: 11.8 10*3/uL — ABNORMAL HIGH (ref 4.0–10.5)

## 2013-11-08 LAB — DIGOXIN LEVEL: Digoxin Level: 0.7 ng/mL — ABNORMAL LOW (ref 0.8–2.0)

## 2013-11-08 LAB — PROCALCITONIN: PROCALCITONIN: 0.6 ng/mL

## 2013-11-09 LAB — COMPREHENSIVE METABOLIC PANEL
ALBUMIN: 2.2 g/dL — AB (ref 3.5–5.2)
ALT: 110 U/L — ABNORMAL HIGH (ref 0–53)
AST: 29 U/L (ref 0–37)
Alkaline Phosphatase: 139 U/L — ABNORMAL HIGH (ref 39–117)
Anion gap: 13 (ref 5–15)
BUN: 62 mg/dL — ABNORMAL HIGH (ref 6–23)
CO2: 14 mEq/L — ABNORMAL LOW (ref 19–32)
CREATININE: 1.37 mg/dL — AB (ref 0.50–1.35)
Calcium: 9.9 mg/dL (ref 8.4–10.5)
Chloride: 110 mEq/L (ref 96–112)
GFR calc Af Amer: 67 mL/min — ABNORMAL LOW (ref >90)
GFR calc non Af Amer: 57 mL/min — ABNORMAL LOW (ref >90)
Glucose, Bld: 162 mg/dL — ABNORMAL HIGH (ref 70–99)
POTASSIUM: 4.6 meq/L (ref 3.7–5.3)
Sodium: 137 mEq/L (ref 137–147)
Total Bilirubin: <0.2 mg/dL — ABNORMAL LOW (ref 0.3–1.2)
Total Protein: 7.6 g/dL (ref 6.0–8.3)

## 2013-11-09 LAB — SEDIMENTATION RATE: Sed Rate: 132 mm/hr — ABNORMAL HIGH (ref 0–16)

## 2013-11-09 LAB — CBC WITH DIFFERENTIAL/PLATELET
BASOS PCT: 1 % (ref 0–1)
Basophils Absolute: 0.1 10*3/uL (ref 0.0–0.1)
EOS ABS: 0.8 10*3/uL — AB (ref 0.0–0.7)
Eosinophils Relative: 6 % — ABNORMAL HIGH (ref 0–5)
HCT: 30.9 % — ABNORMAL LOW (ref 39.0–52.0)
HEMOGLOBIN: 10.3 g/dL — AB (ref 13.0–17.0)
Lymphocytes Relative: 10 % — ABNORMAL LOW (ref 12–46)
Lymphs Abs: 1.4 10*3/uL (ref 0.7–4.0)
MCH: 30.6 pg (ref 26.0–34.0)
MCHC: 33.3 g/dL (ref 30.0–36.0)
MCV: 91.7 fL (ref 78.0–100.0)
MONO ABS: 1.4 10*3/uL — AB (ref 0.1–1.0)
Monocytes Relative: 10 % (ref 3–12)
NEUTROS PCT: 73 % (ref 43–77)
Neutro Abs: 9.9 10*3/uL — ABNORMAL HIGH (ref 1.7–7.7)
Platelets: 326 10*3/uL (ref 150–400)
RBC: 3.37 MIL/uL — AB (ref 4.22–5.81)
RDW: 15.5 % (ref 11.5–15.5)
WBC: 13.6 10*3/uL — ABNORMAL HIGH (ref 4.0–10.5)

## 2013-11-09 LAB — C-REACTIVE PROTEIN: CRP: 11.9 mg/dL — ABNORMAL HIGH (ref <0.60)

## 2013-11-09 LAB — HEMOGLOBIN A1C
HEMOGLOBIN A1C: 5.5 % (ref <5.7)
MEAN PLASMA GLUCOSE: 111 mg/dL (ref <117)

## 2013-11-09 LAB — PREALBUMIN: PREALBUMIN: 14 mg/dL — AB (ref 17.0–34.0)

## 2013-11-10 LAB — PROCALCITONIN: Procalcitonin: 0.81 ng/mL

## 2013-11-10 LAB — CBC WITH DIFFERENTIAL/PLATELET
BASOS ABS: 0.1 10*3/uL (ref 0.0–0.1)
BASOS PCT: 1 % (ref 0–1)
Eosinophils Absolute: 0.3 10*3/uL (ref 0.0–0.7)
Eosinophils Relative: 2 % (ref 0–5)
HEMATOCRIT: 31.4 % — AB (ref 39.0–52.0)
HEMOGLOBIN: 10.2 g/dL — AB (ref 13.0–17.0)
Lymphocytes Relative: 9 % — ABNORMAL LOW (ref 12–46)
Lymphs Abs: 1.2 10*3/uL (ref 0.7–4.0)
MCH: 29.5 pg (ref 26.0–34.0)
MCHC: 32.5 g/dL (ref 30.0–36.0)
MCV: 90.8 fL (ref 78.0–100.0)
MONOS PCT: 8 % (ref 3–12)
Monocytes Absolute: 1.1 10*3/uL — ABNORMAL HIGH (ref 0.1–1.0)
NEUTROS ABS: 10.9 10*3/uL — AB (ref 1.7–7.7)
Neutrophils Relative %: 80 % — ABNORMAL HIGH (ref 43–77)
Platelets: 285 10*3/uL (ref 150–400)
RBC: 3.46 MIL/uL — ABNORMAL LOW (ref 4.22–5.81)
RDW: 15.6 % — AB (ref 11.5–15.5)
WBC: 13.6 10*3/uL — AB (ref 4.0–10.5)

## 2013-11-10 LAB — BASIC METABOLIC PANEL
Anion gap: 14 (ref 5–15)
BUN: 69 mg/dL — AB (ref 6–23)
CO2: 12 mEq/L — ABNORMAL LOW (ref 19–32)
CREATININE: 1.7 mg/dL — AB (ref 0.50–1.35)
Calcium: 10 mg/dL (ref 8.4–10.5)
Chloride: 109 mEq/L (ref 96–112)
GFR calc Af Amer: 51 mL/min — ABNORMAL LOW (ref >90)
GFR calc non Af Amer: 44 mL/min — ABNORMAL LOW (ref >90)
GLUCOSE: 97 mg/dL (ref 70–99)
Potassium: 4.8 mEq/L (ref 3.7–5.3)
Sodium: 135 mEq/L — ABNORMAL LOW (ref 137–147)

## 2013-11-10 LAB — LACTIC ACID, PLASMA: Lactic Acid, Venous: 1 mmol/L (ref 0.5–2.2)

## 2013-11-10 LAB — PREALBUMIN: PREALBUMIN: 13.6 mg/dL — AB (ref 17.0–34.0)

## 2013-11-11 LAB — BASIC METABOLIC PANEL
Anion gap: 14 (ref 5–15)
BUN: 81 mg/dL — AB (ref 6–23)
CHLORIDE: 109 meq/L (ref 96–112)
CO2: 12 mEq/L — ABNORMAL LOW (ref 19–32)
Calcium: 9.7 mg/dL (ref 8.4–10.5)
Creatinine, Ser: 1.71 mg/dL — ABNORMAL HIGH (ref 0.50–1.35)
GFR, EST AFRICAN AMERICAN: 51 mL/min — AB (ref >90)
GFR, EST NON AFRICAN AMERICAN: 44 mL/min — AB (ref >90)
GLUCOSE: 108 mg/dL — AB (ref 70–99)
POTASSIUM: 4.4 meq/L (ref 3.7–5.3)
Sodium: 135 mEq/L — ABNORMAL LOW (ref 137–147)

## 2013-11-11 LAB — CBC WITH DIFFERENTIAL/PLATELET
Basophils Absolute: 0.2 10*3/uL — ABNORMAL HIGH (ref 0.0–0.1)
Basophils Relative: 1 % (ref 0–1)
EOS PCT: 5 % (ref 0–5)
Eosinophils Absolute: 0.8 10*3/uL — ABNORMAL HIGH (ref 0.0–0.7)
HEMATOCRIT: 32 % — AB (ref 39.0–52.0)
Hemoglobin: 10.4 g/dL — ABNORMAL LOW (ref 13.0–17.0)
LYMPHS ABS: 2.1 10*3/uL (ref 0.7–4.0)
Lymphocytes Relative: 14 % (ref 12–46)
MCH: 30.1 pg (ref 26.0–34.0)
MCHC: 32.5 g/dL (ref 30.0–36.0)
MCV: 92.8 fL (ref 78.0–100.0)
Monocytes Absolute: 1.4 10*3/uL — ABNORMAL HIGH (ref 0.1–1.0)
Monocytes Relative: 9 % (ref 3–12)
NEUTROS ABS: 10.6 10*3/uL — AB (ref 1.7–7.7)
Neutrophils Relative %: 71 % (ref 43–77)
Platelets: 284 10*3/uL (ref 150–400)
RBC: 3.45 MIL/uL — AB (ref 4.22–5.81)
RDW: 15.6 % — ABNORMAL HIGH (ref 11.5–15.5)
WBC: 15.1 10*3/uL — ABNORMAL HIGH (ref 4.0–10.5)

## 2013-11-12 ENCOUNTER — Encounter: Payer: Self-pay | Admitting: Physician Assistant

## 2013-11-12 LAB — CBC
HCT: 35.6 % — ABNORMAL LOW (ref 39.0–52.0)
Hemoglobin: 11.3 g/dL — ABNORMAL LOW (ref 13.0–17.0)
MCH: 29.6 pg (ref 26.0–34.0)
MCHC: 31.7 g/dL (ref 30.0–36.0)
MCV: 93.2 fL (ref 78.0–100.0)
PLATELETS: 349 10*3/uL (ref 150–400)
RBC: 3.82 MIL/uL — AB (ref 4.22–5.81)
RDW: 16 % — ABNORMAL HIGH (ref 11.5–15.5)
WBC: 16.6 10*3/uL — AB (ref 4.0–10.5)

## 2013-11-12 LAB — BASIC METABOLIC PANEL
ANION GAP: 16 — AB (ref 5–15)
BUN: 81 mg/dL — ABNORMAL HIGH (ref 6–23)
CO2: 10 mEq/L — CL (ref 19–32)
Calcium: 9.4 mg/dL (ref 8.4–10.5)
Chloride: 111 mEq/L (ref 96–112)
Creatinine, Ser: 1.44 mg/dL — ABNORMAL HIGH (ref 0.50–1.35)
GFR, EST AFRICAN AMERICAN: 63 mL/min — AB (ref >90)
GFR, EST NON AFRICAN AMERICAN: 54 mL/min — AB (ref >90)
Glucose, Bld: 87 mg/dL (ref 70–99)
POTASSIUM: 5 meq/L (ref 3.7–5.3)
SODIUM: 137 meq/L (ref 137–147)

## 2013-11-12 LAB — LACTIC ACID, PLASMA: LACTIC ACID, VENOUS: 1.9 mmol/L (ref 0.5–2.2)

## 2013-11-12 LAB — CLOSTRIDIUM DIFFICILE BY PCR: CDIFFPCR: NEGATIVE
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
## 2013-11-13 LAB — BASIC METABOLIC PANEL
ANION GAP: 15 (ref 5–15)
BUN: 82 mg/dL — AB (ref 6–23)
CHLORIDE: 114 meq/L — AB (ref 96–112)
CO2: 11 mEq/L — ABNORMAL LOW (ref 19–32)
Calcium: 9.2 mg/dL (ref 8.4–10.5)
Creatinine, Ser: 1.4 mg/dL — ABNORMAL HIGH (ref 0.50–1.35)
GFR calc Af Amer: 65 mL/min — ABNORMAL LOW (ref >90)
GFR, EST NON AFRICAN AMERICAN: 56 mL/min — AB (ref >90)
Glucose, Bld: 88 mg/dL (ref 70–99)
Potassium: 4.5 mEq/L (ref 3.7–5.3)
SODIUM: 140 meq/L (ref 137–147)

## 2013-11-13 LAB — PROCALCITONIN: Procalcitonin: 0.46 ng/mL

## 2013-11-13 LAB — CBC WITH DIFFERENTIAL/PLATELET
BASOS ABS: 0.2 10*3/uL — AB (ref 0.0–0.1)
Basophils Relative: 1 % (ref 0–1)
EOS PCT: 6 % — AB (ref 0–5)
Eosinophils Absolute: 1 10*3/uL — ABNORMAL HIGH (ref 0.0–0.7)
HCT: 31.1 % — ABNORMAL LOW (ref 39.0–52.0)
Hemoglobin: 9.9 g/dL — ABNORMAL LOW (ref 13.0–17.0)
Lymphocytes Relative: 9 % — ABNORMAL LOW (ref 12–46)
Lymphs Abs: 1.5 10*3/uL (ref 0.7–4.0)
MCH: 29.8 pg (ref 26.0–34.0)
MCHC: 31.8 g/dL (ref 30.0–36.0)
MCV: 93.7 fL (ref 78.0–100.0)
MONOS PCT: 5 % (ref 3–12)
Monocytes Absolute: 0.9 10*3/uL (ref 0.1–1.0)
NEUTROS PCT: 79 % — AB (ref 43–77)
Neutro Abs: 13.5 10*3/uL — ABNORMAL HIGH (ref 1.7–7.7)
PLATELETS: 369 10*3/uL (ref 150–400)
RBC: 3.32 MIL/uL — AB (ref 4.22–5.81)
RDW: 16 % — AB (ref 11.5–15.5)
WBC: 17.1 10*3/uL — AB (ref 4.0–10.5)

## 2013-11-13 LAB — DIGOXIN LEVEL: Digoxin Level: 0.9 ng/mL (ref 0.8–2.0)

## 2013-11-13 LAB — PREALBUMIN: PREALBUMIN: 8.7 mg/dL — AB (ref 17.0–34.0)

## 2013-11-14 LAB — BASIC METABOLIC PANEL
Anion gap: 12 (ref 5–15)
BUN: 74 mg/dL — AB (ref 6–23)
CO2: 14 meq/L — AB (ref 19–32)
Calcium: 9.4 mg/dL (ref 8.4–10.5)
Chloride: 114 mEq/L — ABNORMAL HIGH (ref 96–112)
Creatinine, Ser: 1.4 mg/dL — ABNORMAL HIGH (ref 0.50–1.35)
GFR calc Af Amer: 65 mL/min — ABNORMAL LOW (ref >90)
GFR calc non Af Amer: 56 mL/min — ABNORMAL LOW (ref >90)
Glucose, Bld: 117 mg/dL — ABNORMAL HIGH (ref 70–99)
Potassium: 4.5 mEq/L (ref 3.7–5.3)
Sodium: 140 mEq/L (ref 137–147)

## 2013-11-14 LAB — CBC WITH DIFFERENTIAL/PLATELET
BASOS ABS: 0.1 10*3/uL (ref 0.0–0.1)
Basophils Relative: 1 % (ref 0–1)
EOS PCT: 5 % (ref 0–5)
Eosinophils Absolute: 0.7 10*3/uL (ref 0.0–0.7)
HCT: 28.1 % — ABNORMAL LOW (ref 39.0–52.0)
Hemoglobin: 9.2 g/dL — ABNORMAL LOW (ref 13.0–17.0)
LYMPHS ABS: 1.9 10*3/uL (ref 0.7–4.0)
Lymphocytes Relative: 14 % (ref 12–46)
MCH: 30.5 pg (ref 26.0–34.0)
MCHC: 32.7 g/dL (ref 30.0–36.0)
MCV: 93 fL (ref 78.0–100.0)
Monocytes Absolute: 1.2 10*3/uL — ABNORMAL HIGH (ref 0.1–1.0)
Monocytes Relative: 9 % (ref 3–12)
Neutro Abs: 9.8 10*3/uL — ABNORMAL HIGH (ref 1.7–7.7)
Neutrophils Relative %: 71 % (ref 43–77)
Platelets: 340 10*3/uL (ref 150–400)
RBC: 3.02 MIL/uL — ABNORMAL LOW (ref 4.22–5.81)
RDW: 16 % — AB (ref 11.5–15.5)
WBC: 13.7 10*3/uL — ABNORMAL HIGH (ref 4.0–10.5)

## 2013-11-14 LAB — LACTIC ACID, PLASMA: LACTIC ACID, VENOUS: 0.8 mmol/L (ref 0.5–2.2)

## 2013-11-16 ENCOUNTER — Other Ambulatory Visit: Payer: Self-pay | Admitting: Plastic Surgery

## 2013-11-16 DIAGNOSIS — L89154 Pressure ulcer of sacral region, stage 4: Secondary | ICD-10-CM

## 2013-11-16 LAB — CBC WITH DIFFERENTIAL/PLATELET
Basophils Absolute: 0.1 10*3/uL (ref 0.0–0.1)
Basophils Relative: 0 % (ref 0–1)
Eosinophils Absolute: 0.6 10*3/uL (ref 0.0–0.7)
Eosinophils Relative: 4 % (ref 0–5)
HEMATOCRIT: 21.6 % — AB (ref 39.0–52.0)
HEMATOCRIT: 30.9 % — AB (ref 39.0–52.0)
HEMOGLOBIN: 10 g/dL — AB (ref 13.0–17.0)
Hemoglobin: 7 g/dL — ABNORMAL LOW (ref 13.0–17.0)
LYMPHS PCT: 13 % (ref 12–46)
Lymphs Abs: 1.8 10*3/uL (ref 0.7–4.0)
MCH: 30.6 pg (ref 26.0–34.0)
MCH: 29.7 pg (ref 26.0–34.0)
MCHC: 32.4 g/dL (ref 30.0–36.0)
MCHC: 32.4 g/dL (ref 30.0–36.0)
MCV: 94.3 fL (ref 78.0–100.0)
MCV: 91.7 fL (ref 78.0–100.0)
MONO ABS: 0.8 10*3/uL (ref 0.1–1.0)
MONOS PCT: 6 % (ref 3–12)
NEUTROS ABS: 10.9 10*3/uL — AB (ref 1.7–7.7)
Neutrophils Relative %: 77 % (ref 43–77)
Platelets: 383 10*3/uL (ref 150–400)
RBC: 2.29 MIL/uL — AB (ref 4.22–5.81)
RBC: 3.37 MIL/uL — AB (ref 4.22–5.81)
RDW: 16.2 % — ABNORMAL HIGH (ref 11.5–15.5)
RDW: 16.3 % — ABNORMAL HIGH (ref 11.5–15.5)
WBC: 5 10*3/uL (ref 4.0–10.5)
WBC: 14.1 10*3/uL — AB (ref 4.0–10.5)

## 2013-11-16 LAB — COMPREHENSIVE METABOLIC PANEL
ALBUMIN: 2 g/dL — AB (ref 3.5–5.2)
ALBUMIN: 2.1 g/dL — AB (ref 3.5–5.2)
ALK PHOS: 121 U/L — AB (ref 39–117)
ALT: 83 U/L — ABNORMAL HIGH (ref 0–53)
ALT: 77 U/L — ABNORMAL HIGH (ref 0–53)
ANION GAP: 13 (ref 5–15)
AST: 91 U/L — ABNORMAL HIGH (ref 0–37)
AST: 27 U/L (ref 0–37)
Alkaline Phosphatase: 134 U/L — ABNORMAL HIGH (ref 39–117)
Anion gap: 13 (ref 5–15)
BILIRUBIN TOTAL: 0.2 mg/dL — AB (ref 0.3–1.2)
BUN: 76 mg/dL — AB (ref 6–23)
BUN: 76 mg/dL — AB (ref 6–23)
CALCIUM: 9.2 mg/dL (ref 8.4–10.5)
CHLORIDE: 112 meq/L (ref 96–112)
CO2: 11 mEq/L — ABNORMAL LOW (ref 19–32)
CO2: 11 mEq/L — ABNORMAL LOW (ref 19–32)
CREATININE: 1.3 mg/dL (ref 0.50–1.35)
Calcium: 8.6 mg/dL (ref 8.4–10.5)
Chloride: 112 mEq/L (ref 96–112)
Creatinine, Ser: 1.12 mg/dL (ref 0.50–1.35)
GFR calc non Af Amer: 73 mL/min — ABNORMAL LOW (ref >90)
GFR calc non Af Amer: 61 mL/min — ABNORMAL LOW (ref >90)
GFR, EST AFRICAN AMERICAN: 85 mL/min — AB (ref >90)
GFR, EST AFRICAN AMERICAN: 71 mL/min — AB (ref >90)
GLUCOSE: 112 mg/dL — AB (ref 70–99)
GLUCOSE: 168 mg/dL — AB (ref 70–99)
POTASSIUM: 7.2 meq/L — AB (ref 3.7–5.3)
Potassium: 4.8 mEq/L (ref 3.7–5.3)
Sodium: 136 mEq/L — ABNORMAL LOW (ref 137–147)
Sodium: 136 mEq/L — ABNORMAL LOW (ref 137–147)
TOTAL PROTEIN: 8.1 g/dL (ref 6.0–8.3)
Total Bilirubin: <0.2 mg/dL — ABNORMAL LOW (ref 0.3–1.2)
Total Protein: 7.3 g/dL (ref 6.0–8.3)

## 2013-11-16 LAB — CULTURE, BLOOD (ROUTINE X 2)
CULTURE: NO GROWTH
Culture: NO GROWTH

## 2013-11-16 LAB — C-REACTIVE PROTEIN: CRP: 11.5 mg/dL — ABNORMAL HIGH (ref <0.60)

## 2013-11-16 LAB — PREALBUMIN: PREALBUMIN: 13.9 mg/dL — AB (ref 17.0–34.0)

## 2013-11-16 LAB — SEDIMENTATION RATE: Sed Rate: 135 mm/hr — ABNORMAL HIGH (ref 0–16)

## 2013-11-16 NOTE — Consult Note (Signed)
Agree with plan 

## 2013-11-17 ENCOUNTER — Encounter (HOSPITAL_COMMUNITY): Payer: Self-pay

## 2013-11-18 ENCOUNTER — Encounter (HOSPITAL_COMMUNITY): Payer: Self-pay | Admitting: Anesthesiology

## 2013-11-18 ENCOUNTER — Encounter
Admission: AD | Disposition: A | Discharge status: Skilled Nursing Facility | Payer: Self-pay | Source: Ambulatory Visit | Attending: Internal Medicine

## 2013-11-18 ENCOUNTER — Encounter (HOSPITAL_COMMUNITY): Payer: Medicare Other | Admitting: Anesthesiology

## 2013-11-18 ENCOUNTER — Encounter: Payer: Self-pay | Admitting: Anesthesiology

## 2013-11-18 HISTORY — PX: APPLICATION OF A-CELL OF EXTREMITY: SHX6303

## 2013-11-18 HISTORY — PX: INCISION AND DRAINAGE OF WOUND: SHX1803

## 2013-11-18 HISTORY — PX: MINOR APPLICATION OF WOUND VAC: SHX6243

## 2013-11-18 LAB — CBC WITH DIFFERENTIAL/PLATELET
Basophils Absolute: 0.1 10*3/uL (ref 0.0–0.1)
Basophils Relative: 1 % (ref 0–1)
EOS ABS: 0.5 10*3/uL (ref 0.0–0.7)
Eosinophils Relative: 4 % (ref 0–5)
HCT: 30.8 % — ABNORMAL LOW (ref 39.0–52.0)
Hemoglobin: 9.7 g/dL — ABNORMAL LOW (ref 13.0–17.0)
Lymphocytes Relative: 12 % (ref 12–46)
Lymphs Abs: 1.6 10*3/uL (ref 0.7–4.0)
MCH: 29.8 pg (ref 26.0–34.0)
MCHC: 31.5 g/dL (ref 30.0–36.0)
MCV: 94.5 fL (ref 78.0–100.0)
Monocytes Absolute: 1.2 10*3/uL — ABNORMAL HIGH (ref 0.1–1.0)
Monocytes Relative: 9 % (ref 3–12)
Neutro Abs: 10.6 10*3/uL — ABNORMAL HIGH (ref 1.7–7.7)
Neutrophils Relative %: 76 % (ref 43–77)
PLATELETS: 402 10*3/uL — AB (ref 150–400)
RBC: 3.26 MIL/uL — ABNORMAL LOW (ref 4.22–5.81)
RDW: 16.5 % — ABNORMAL HIGH (ref 11.5–15.5)
WBC: 14 10*3/uL — AB (ref 4.0–10.5)

## 2013-11-18 LAB — BASIC METABOLIC PANEL
Anion gap: 11 (ref 5–15)
BUN: 77 mg/dL — ABNORMAL HIGH (ref 6–23)
CO2: 12 mEq/L — ABNORMAL LOW (ref 19–32)
Calcium: 9.7 mg/dL (ref 8.4–10.5)
Chloride: 117 mEq/L — ABNORMAL HIGH (ref 96–112)
Creatinine, Ser: 1.46 mg/dL — ABNORMAL HIGH (ref 0.50–1.35)
GFR, EST AFRICAN AMERICAN: 62 mL/min — AB (ref >90)
GFR, EST NON AFRICAN AMERICAN: 53 mL/min — AB (ref >90)
GLUCOSE: 105 mg/dL — AB (ref 70–99)
POTASSIUM: 5.3 meq/L (ref 3.7–5.3)
SODIUM: 140 meq/L (ref 137–147)

## 2013-11-18 LAB — MAGNESIUM: Magnesium: 2.5 mg/dL (ref 1.5–2.5)

## 2013-11-18 LAB — GLUCOSE, CAPILLARY
GLUCOSE-CAPILLARY: 76 mg/dL (ref 70–99)
Glucose-Capillary: 77 mg/dL (ref 70–99)

## 2013-11-18 LAB — PHOSPHORUS: PHOSPHORUS: 3.7 mg/dL (ref 2.3–4.6)

## 2013-11-18 SURGERY — IRRIGATION AND DEBRIDEMENT WOUND
Anesthesia: General

## 2013-11-18 MED ORDER — LACTATED RINGERS IV SOLN
INTRAVENOUS | Status: DC | PRN
  Administered 2013-11-18: 10:00:00 via INTRAVENOUS

## 2013-11-18 MED ORDER — PROPOFOL 10 MG/ML IV BOLUS
INTRAVENOUS | Status: DC | PRN
  Administered 2013-11-18: 110 mg via INTRAVENOUS

## 2013-11-18 MED ORDER — ONDANSETRON HCL 4 MG/2ML IJ SOLN
INTRAMUSCULAR | Status: DC | PRN
  Administered 2013-11-18: 4 mg via INTRAVENOUS

## 2013-11-18 MED ORDER — MIDAZOLAM HCL 2 MG/2ML IJ SOLN
INTRAMUSCULAR | Status: AC
  Filled 2013-11-18: qty 2

## 2013-11-18 MED ORDER — SODIUM CHLORIDE 0.9 % IR SOLN
Status: DC | PRN
  Administered 2013-11-18: 11:00:00

## 2013-11-18 MED ORDER — CEFAZOLIN SODIUM-DEXTROSE 2-3 GM-% IV SOLR
INTRAVENOUS | Status: AC
  Filled 2013-11-18: qty 50

## 2013-11-18 MED ORDER — LIDOCAINE HCL (CARDIAC) 20 MG/ML IV SOLN
INTRAVENOUS | Status: DC | PRN
  Administered 2013-11-18: 100 mg via INTRAVENOUS

## 2013-11-18 MED ORDER — ROCURONIUM BROMIDE 50 MG/5ML IV SOLN
INTRAVENOUS | Status: AC
Start: 2013-11-18 — End: 2013-11-18
  Filled 2013-11-18: qty 1

## 2013-11-18 MED ORDER — PHENYLEPHRINE HCL 10 MG/ML IJ SOLN
10.0000 mg | INTRAVENOUS | Status: DC | PRN
  Administered 2013-11-18: 10 ug/min via INTRAVENOUS

## 2013-11-18 MED ORDER — FENTANYL CITRATE 0.05 MG/ML IJ SOLN
INTRAMUSCULAR | Status: DC | PRN
Start: 2013-11-18 — End: 2013-11-18
  Administered 2013-11-18: 25 ug via INTRAVENOUS
  Administered 2013-11-18: 50 ug via INTRAVENOUS
  Administered 2013-11-18: 25 ug via INTRAVENOUS

## 2013-11-18 MED ORDER — PROPOFOL 10 MG/ML IV BOLUS
INTRAVENOUS | Status: AC
  Filled 2013-11-18: qty 20

## 2013-11-18 MED ORDER — HYDROMORPHONE HCL PF 1 MG/ML IJ SOLN: 0.2500 mg | INTRAMUSCULAR | Status: DC | PRN

## 2013-11-18 MED ORDER — SUCCINYLCHOLINE CHLORIDE 20 MG/ML IJ SOLN
INTRAMUSCULAR | Status: AC
  Filled 2013-11-18: qty 1

## 2013-11-18 MED ORDER — LIDOCAINE HCL 4 % MT SOLN
OROMUCOSAL | Status: DC | PRN
Start: 2013-11-18 — End: 2013-11-18
  Administered 2013-11-18: 10 mL via TOPICAL

## 2013-11-18 MED ORDER — ONDANSETRON HCL 4 MG/2ML IJ SOLN: 4.0000 mg | Freq: Once | INTRAMUSCULAR | Status: DC | PRN

## 2013-11-18 MED ORDER — MIDAZOLAM HCL 5 MG/5ML IJ SOLN
INTRAMUSCULAR | Status: DC | PRN
  Administered 2013-11-18: 2 mg via INTRAVENOUS

## 2013-11-18 MED ORDER — 0.9 % SODIUM CHLORIDE (POUR BTL) OPTIME
TOPICAL | Status: DC | PRN
  Administered 2013-11-18: 1000 mL

## 2013-11-18 MED ORDER — CEFAZOLIN SODIUM-DEXTROSE 2-3 GM-% IV SOLR
INTRAVENOUS | Status: DC | PRN
  Administered 2013-11-18: 2 g via INTRAVENOUS

## 2013-11-18 MED ORDER — PROPOFOL 10 MG/ML IV BOLUS
INTRAVENOUS | Status: AC
Start: 2013-11-18 — End: 2013-11-18
  Filled 2013-11-18: qty 20

## 2013-11-18 MED ORDER — ALBUMIN HUMAN 5 % IV SOLN
INTRAVENOUS | Status: DC | PRN
  Administered 2013-11-18: 11:00:00 via INTRAVENOUS

## 2013-11-18 MED ORDER — LACTATED RINGERS IV SOLN
INTRAVENOUS | Status: DC
  Administered 2013-11-18: 11:00:00 via INTRAVENOUS

## 2013-11-18 MED ORDER — FENTANYL CITRATE 0.05 MG/ML IJ SOLN
INTRAMUSCULAR | Status: AC
  Filled 2013-11-18: qty 5

## 2013-11-18 MED ORDER — ARTIFICIAL TEARS OP OINT
TOPICAL_OINTMENT | OPHTHALMIC | Status: DC | PRN
  Administered 2013-11-18: 1 via OPHTHALMIC

## 2013-11-18 SURGICAL SUPPLY — 53 items
BAG DECANTER FOR FLEXI CONT (MISCELLANEOUS) IMPLANT
BLADE 10 SAFETY STRL DISP (BLADE) ×3 IMPLANT
BLADE SURG 10 STRL SS (BLADE) ×3 IMPLANT
BLADE SURG ROTATE 9660 (MISCELLANEOUS) IMPLANT
BNDG GAUZE ELAST 4 BULKY (GAUZE/BANDAGES/DRESSINGS) ×3 IMPLANT
CANISTER SUCTION 2500CC (MISCELLANEOUS) ×3 IMPLANT
CANISTER WOUND CARE 500ML ATS (WOUND CARE) ×3 IMPLANT
CHLORAPREP W/TINT 26ML (MISCELLANEOUS) IMPLANT
CONT SPEC STER OR (MISCELLANEOUS) IMPLANT
COVER SURGICAL LIGHT HANDLE (MISCELLANEOUS) ×3 IMPLANT
DRAPE INCISE IOBAN 66X45 STRL (DRAPES) ×3 IMPLANT
DRAPE ORTHO SPLIT 77X108 STRL (DRAPES) ×4
DRAPE PED LAPAROTOMY (DRAPES) ×3 IMPLANT
DRAPE PROXIMA HALF (DRAPES) IMPLANT
DRAPE SURG ORHT 6 SPLT 77X108 (DRAPES) ×2 IMPLANT
DRSG ADAPTIC 3X8 NADH LF (GAUZE/BANDAGES/DRESSINGS) ×6 IMPLANT
DRSG PAD ABDOMINAL 8X10 ST (GAUZE/BANDAGES/DRESSINGS) ×3 IMPLANT
DRSG VAC ATS LRG SENSATRAC (GAUZE/BANDAGES/DRESSINGS) IMPLANT
DRSG VAC ATS MED SENSATRAC (GAUZE/BANDAGES/DRESSINGS) ×3 IMPLANT
DRSG VAC ATS SM SENSATRAC (GAUZE/BANDAGES/DRESSINGS) IMPLANT
ELECT CAUTERY BLADE 6.4 (BLADE) ×3 IMPLANT
ELECT REM PT RETURN 9FT ADLT (ELECTROSURGICAL) ×3
ELECTRODE REM PT RTRN 9FT ADLT (ELECTROSURGICAL) ×1 IMPLANT
GAUZE SPONGE 4X4 12PLY STRL (GAUZE/BANDAGES/DRESSINGS) ×3 IMPLANT
GLOVE BIO SURGEON STRL SZ 6.5 (GLOVE) ×2 IMPLANT
GLOVE BIO SURGEONS STRL SZ 6.5 (GLOVE) ×1
GLOVE BIOGEL M 6.5 STRL (GLOVE) ×3 IMPLANT
GLOVE BIOGEL M STER SZ 6 (GLOVE) ×6 IMPLANT
GLOVE BIOGEL PI IND STRL 6.5 (GLOVE) ×3 IMPLANT
GLOVE BIOGEL PI IND STRL 7.5 (GLOVE) ×1 IMPLANT
GLOVE BIOGEL PI INDICATOR 6.5 (GLOVE) ×6
GLOVE BIOGEL PI INDICATOR 7.5 (GLOVE) ×2
GLOVE ECLIPSE 6.5 STRL STRAW (GLOVE) ×3 IMPLANT
GOWN STRL REUS W/ TWL LRG LVL3 (GOWN DISPOSABLE) ×2 IMPLANT
GOWN STRL REUS W/TWL LRG LVL3 (GOWN DISPOSABLE) ×4
KIT BASIN OR (CUSTOM PROCEDURE TRAY) ×3 IMPLANT
KIT ROOM TURNOVER OR (KITS) ×3 IMPLANT
MATRIX SURGICAL PSM 10X15CM (Tissue) ×3 IMPLANT
MICROMATRIX 1000MG (Tissue) ×3 IMPLANT
MICROMATRIX 500MG (Tissue) ×9 IMPLANT
NS IRRIG 1000ML POUR BTL (IV SOLUTION) ×3 IMPLANT
PACK GENERAL/GYN (CUSTOM PROCEDURE TRAY) ×3 IMPLANT
PAD ARMBOARD 7.5X6 YLW CONV (MISCELLANEOUS) ×6 IMPLANT
SOLUTION PARTIC MCRMTRX 1000MG (Tissue) ×1 IMPLANT
SOLUTION PARTIC MCRMTRX 500MG (Tissue) ×3 IMPLANT
SURGILUBE 2OZ TUBE FLIPTOP (MISCELLANEOUS) IMPLANT
SUT VIC AB 5-0 PS2 18 (SUTURE) ×21 IMPLANT
SWAB COLLECTION DEVICE MRSA (MISCELLANEOUS) IMPLANT
TAPE CLOTH SURG 6X10 WHT LF (GAUZE/BANDAGES/DRESSINGS) ×3 IMPLANT
TOWEL OR 17X24 6PK STRL BLUE (TOWEL DISPOSABLE) ×3 IMPLANT
TOWEL OR 17X26 10 PK STRL BLUE (TOWEL DISPOSABLE) ×3 IMPLANT
TUBE ANAEROBIC SPECIMEN COL (MISCELLANEOUS) IMPLANT
UNDERPAD 30X30 INCONTINENT (UNDERPADS AND DIAPERS) ×3 IMPLANT

## 2013-11-18 NOTE — Anesthesia Postprocedure Evaluation (Signed)
  Anesthesia Post-op Note  Patient: Clayton Gross  Procedure(s) Performed: Procedure(s): IRRIGATION AND DEBRIDEMENT WOUND,LEFT  SACRAL WOUND (N/A) APPLICATION OF WOUND VAC POSTERIOR LEFT LOWER LEG (N/A) APPLICATION OF A-CELL ON LEFT SACRAL WOUND & POSTERIOR LEFT LOWER OF EXTREMITY (N/A)  Patient Location: PACU  Anesthesia Type:General  Level of Consciousness: awake, alert , oriented and patient cooperative  Airway and Oxygen Therapy: Patient Spontanous Breathing  Post-op Pain: moderate  Post-op Assessment: Post-op Vital signs reviewed, Patient's Cardiovascular Status Stable, Respiratory Function Stable, Patent Airway and No signs of Nausea or vomiting  Post-op Vital Signs: stable  Last Vitals:  Filed Vitals:   11/18/13 1300  BP: 115/62  Pulse: 75  Temp: 36.6 C  Resp: 24    Complications: No apparent anesthesia complications

## 2013-11-18 NOTE — Op Note (Signed)
Operative Note   DATE OF OPERATION: 11/18/2013  LOCATION: [facility]  SURGICAL DIVISION: Plastic Surgery  PREOPERATIVE DIAGNOSES:  Sacral ulcer (200 cm 2) and left leg ulcer (15 x 3 cm)  POSTOPERATIVE DIAGNOSES:  same  PROCEDURE:  Preparation of sacral ulcer and left leg ulcer with debridement of skin, muscle and bone (sacral) with placement of Acell (2 gm and 10 x 15 sheet) to both and VAC (leg)  SURGEON: Wayland Denis, DO  ASSISTANT: Shawn Rayburn, PA  ANESTHESIA:  General.   COMPLICATIONS: None.   INDICATIONS FOR PROCEDURE:  The patient, Clayton Gross is a 53 y.o. male born on 07/02/1960, is here for treatment of sacral and left leg ulcer MRN: [id number]  CONSENT:  Informed consent was obtained directly from the patient. Risks, benefits and alternatives were fully discussed. Specific risks including but not limited to bleeding, infection, hematoma, seroma, scarring, pain, infection, contracture, asymmetry, wound healing problems, and need for further surgery were all discussed. The patient did have an ample opportunity to have questions answered to satisfaction.   DESCRIPTION OF PROCEDURE:  The patient was taken to the operating room. SCDs were placed and IV antibiotics were given. The patient's operative site was prepped and draped in a sterile fashion. A time out was performed and all information was confirmed to be correct.  General anesthesia was administered.  The sacral area was debrided for preparation of Acell.  The skin, muscle and bone were debrided.  The area was irrigated with antibiotic solution. Hemostasis was achieved with electrocautery. The Acell sheet and powder were applied followed with adaptic and Kerlex.  The leg was debrided and Acell sheet and powder placed.  An adaptic was applied with the VAC.  An excellent seal was obtained.  The patient tolerated the procedure well.  There were no complications. The patient was allowed to wake from anesthesia,  extubated and taken to the recovery room in satisfactory condition.
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
## 2013-11-18 NOTE — Anesthesia Preprocedure Evaluation (Addendum)
Anesthesia Evaluation  Patient identified by MRN, date of birth, ID band Patient awake    Reviewed: Allergy & Precautions, H&P , NPO status , Patient's Chart, lab work & pertinent test results, reviewed documented beta blocker date and time   Airway Mallampati: II TM Distance: >3 FB     Dental  (+) Teeth Intact, Dental Advisory Given   Pulmonary former smoker,  breath sounds clear to auscultation        Cardiovascular hypertension, Rhythm:Regular Rate:Normal     Neuro/Psych negative neurological ROS  negative psych ROS   GI/Hepatic negative GI ROS, Neg liver ROS,   Endo/Other  Hypothyroidism   Renal/GU Renal InsufficiencyRenal disease     Musculoskeletal negative musculoskeletal ROS (+)   Abdominal (+)  Abdomen: soft. Bowel sounds: normal.  Peds  Hematology negative hematology ROS (+)   Anesthesia Other Findings   Reproductive/Obstetrics negative OB ROS                       Anesthesia Physical Anesthesia Plan  ASA: III  Anesthesia Plan: General   Post-op Pain Management:    Induction: Intravenous  Airway Management Planned: Oral ETT  Additional Equipment:   Intra-op Plan:   Post-operative Plan: Extubation in OR  Informed Consent: I have reviewed the patients History and Physical, chart, labs and discussed the procedure including the risks, benefits and alternatives for the proposed anesthesia with the patient or authorized representative who has indicated his/her understanding and acceptance.     Plan Discussed with: CRNA, Anesthesiologist and Surgeon  Anesthesia Plan Comments:         Anesthesia Quick Evaluation

## 2013-11-18 NOTE — Interval H&P Note (Signed)
History and Physical Interval Note:  11/18/2013 10:24 AM  Clayton Gross  has presented today for surgery, with the diagnosis of sacral ulcers  The various methods of treatment have been discussed with the patient and family. After consideration of risks, benefits and other options for treatment, the patient has consented to  Procedure(s): IRRIGATION AND DEBRIDEMENT WOUND, sacrum and perineum and possible wound vac (N/A) MINOR APPLICATION OF WOUND VAC (N/A) APPLICATION OF A-CELL OF EXTREMITY (N/A) as a surgical intervention .  The patient's history has been reviewed, patient examined, no change in status, stable for surgery.  I have reviewed the patient's chart and labs.  Questions were answered to the patient's satisfaction.     SANGER,CLAIRE

## 2013-11-18 NOTE — Anesthesia Procedure Notes (Addendum)
Performed by: Dollene Cleveland M    Procedure Name: Intubation Date/Time: 11/18/2013 10:55 AM Performed by: Maeola Harman Pre-anesthesia Checklist: Patient identified, Emergency Drugs available, Suction available, Timeout performed and Patient being monitored Patient Re-evaluated:Patient Re-evaluated prior to inductionOxygen Delivery Method: Circle system utilized Preoxygenation: Pre-oxygenation with 100% oxygen Intubation Type: IV induction and Inhalational induction Ventilation: Mask ventilation without difficulty Laryngoscope Size: Mac and 3 Grade View: Grade I Tube type: Oral Tube size: 7.5 mm Number of attempts: 1 Airway Equipment and Method: LTA kit utilized Placement Confirmation: ETT inserted through vocal cords under direct vision,  breath sounds checked- equal and bilateral,  positive ETCO2 and CO2 detector Secured at: 23 cm Tube secured with: Tape Dental Injury: Teeth and Oropharynx as per pre-operative assessment  Comments: Intubation by Vilinda Blanks, SRNA and Dr. Tamala Julian.

## 2013-11-18 NOTE — H&P (View-Only) (Signed)
Agree with plan 

## 2013-11-18 NOTE — Transfer of Care (Signed)
Immediate Anesthesia Transfer of Care Note  Patient: Clayton Gross  Procedure(s) Performed: Procedure(s): IRRIGATION AND DEBRIDEMENT WOUND,LEFT  SACRAL WOUND (N/A) APPLICATION OF WOUND VAC POSTERIOR LEFT LOWER LEG (N/A) APPLICATION OF A-CELL ON LEFT SACRAL WOUND & POSTERIOR LEFT LOWER OF EXTREMITY (N/A)  Patient Location: PACU  Anesthesia Type:General  Level of Consciousness: awake, alert  and oriented  Airway & Oxygen Therapy: Patient connected to face mask oxygen  Post-op Assessment: Report given to PACU RN  Post vital signs: stable  Complications: No apparent anesthesia complications

## 2013-11-18 NOTE — Brief Op Note (Signed)
10/27/2013 - 11/18/2013  12:09 PM  PATIENT:  Clayton Gross  53 y.o. male  PRE-OPERATIVE DIAGNOSIS:  sacral ulcers  POST-OPERATIVE DIAGNOSIS:  sacral ulcers  PROCEDURE:  Procedure(s): IRRIGATION AND DEBRIDEMENT WOUND, sacrum and perineum and possible wound vac (N/A) MINOR APPLICATION OF WOUND VAC (N/A) APPLICATION OF A-CELL OF EXTREMITY (N/A)  SURGEON:  Surgeon(s) and Role:    * Claire Sanger, DO - Primary  PHYSICIAN ASSISTANT: Shawn Rayburn, PA  ASSISTANTS: none   ANESTHESIA:   general  EBL:  Total I/O In: 750 [I.V.:500; IV Piggyback:250] Out: -   BLOOD ADMINISTERED:none  DRAINS: none   LOCAL MEDICATIONS USED:  NONE  SPECIMEN:  Source of Specimen:  bone  DISPOSITION OF SPECIMEN:  micro  COUNTS:  YES  TOURNIQUET:  * No tourniquets in log *  DICTATION: .Dragon Dictation  PLAN OF CARE: Admit to inpatient   PATIENT DISPOSITION:  PACU - hemodynamically stable.   Delay start of Pharmacological VTE agent (>24hrs) due to surgical blood loss or risk of bleeding: no

## 2013-11-19 ENCOUNTER — Encounter (HOSPITAL_COMMUNITY): Payer: Self-pay | Admitting: Plastic Surgery

## 2013-11-20 LAB — CBC WITH DIFFERENTIAL/PLATELET
Basophils Absolute: 0.1 10*3/uL (ref 0.0–0.1)
Basophils Relative: 0 % (ref 0–1)
EOS ABS: 0.6 10*3/uL (ref 0.0–0.7)
Eosinophils Relative: 4 % (ref 0–5)
HCT: 28.6 % — ABNORMAL LOW (ref 39.0–52.0)
HEMOGLOBIN: 8.9 g/dL — AB (ref 13.0–17.0)
LYMPHS ABS: 1.8 10*3/uL (ref 0.7–4.0)
Lymphocytes Relative: 12 % (ref 12–46)
MCH: 29 pg (ref 26.0–34.0)
MCHC: 31.1 g/dL (ref 30.0–36.0)
MCV: 93.2 fL (ref 78.0–100.0)
Monocytes Absolute: 1 10*3/uL (ref 0.1–1.0)
Monocytes Relative: 7 % (ref 3–12)
NEUTROS PCT: 77 % (ref 43–77)
Neutro Abs: 12.1 10*3/uL — ABNORMAL HIGH (ref 1.7–7.7)
Platelets: 404 10*3/uL — ABNORMAL HIGH (ref 150–400)
RBC: 3.07 MIL/uL — AB (ref 4.22–5.81)
RDW: 16.7 % — ABNORMAL HIGH (ref 11.5–15.5)
WBC: 15.6 10*3/uL — AB (ref 4.0–10.5)

## 2013-11-20 LAB — BASIC METABOLIC PANEL
Anion gap: 14 (ref 5–15)
BUN: 80 mg/dL — ABNORMAL HIGH (ref 6–23)
CHLORIDE: 115 meq/L — AB (ref 96–112)
CO2: 13 mEq/L — ABNORMAL LOW (ref 19–32)
Calcium: 9.4 mg/dL (ref 8.4–10.5)
Creatinine, Ser: 1.28 mg/dL (ref 0.50–1.35)
GFR calc Af Amer: 72 mL/min — ABNORMAL LOW (ref >90)
GFR, EST NON AFRICAN AMERICAN: 62 mL/min — AB (ref >90)
GLUCOSE: 127 mg/dL — AB (ref 70–99)
POTASSIUM: 4.9 meq/L (ref 3.7–5.3)
SODIUM: 142 meq/L (ref 137–147)

## 2013-11-20 LAB — PHOSPHORUS: PHOSPHORUS: 4 mg/dL (ref 2.3–4.6)

## 2013-11-20 LAB — MAGNESIUM: MAGNESIUM: 2.3 mg/dL (ref 1.5–2.5)

## 2013-11-20 LAB — DIGOXIN LEVEL: DIGOXIN LVL: 0.7 ng/mL — AB (ref 0.8–2.0)

## 2013-11-22 LAB — WOUND CULTURE

## 2013-11-22 LAB — TISSUE CULTURE

## 2013-11-23 LAB — CBC WITH DIFFERENTIAL/PLATELET
Basophils Absolute: 0.1 10*3/uL (ref 0.0–0.1)
Basophils Relative: 1 % (ref 0–1)
EOS ABS: 0.6 10*3/uL (ref 0.0–0.7)
Eosinophils Relative: 4 % (ref 0–5)
HCT: 32.5 % — ABNORMAL LOW (ref 39.0–52.0)
HEMOGLOBIN: 10.2 g/dL — AB (ref 13.0–17.0)
LYMPHS ABS: 1.7 10*3/uL (ref 0.7–4.0)
Lymphocytes Relative: 11 % — ABNORMAL LOW (ref 12–46)
MCH: 29.8 pg (ref 26.0–34.0)
MCHC: 31.4 g/dL (ref 30.0–36.0)
MCV: 95 fL (ref 78.0–100.0)
Monocytes Absolute: 0.9 10*3/uL (ref 0.1–1.0)
Monocytes Relative: 6 % (ref 3–12)
NEUTROS ABS: 11.2 10*3/uL — AB (ref 1.7–7.7)
NEUTROS PCT: 78 % — AB (ref 43–77)
PLATELETS: 411 10*3/uL — AB (ref 150–400)
RBC: 3.42 MIL/uL — AB (ref 4.22–5.81)
RDW: 16.7 % — ABNORMAL HIGH (ref 11.5–15.5)
WBC: 14.5 10*3/uL — ABNORMAL HIGH (ref 4.0–10.5)

## 2013-11-23 LAB — COMPREHENSIVE METABOLIC PANEL
ALBUMIN: 2.5 g/dL — AB (ref 3.5–5.2)
ALT: 71 U/L — AB (ref 0–53)
AST: 28 U/L (ref 0–37)
Alkaline Phosphatase: 79 U/L (ref 39–117)
Anion gap: 15 (ref 5–15)
BILIRUBIN TOTAL: 0.2 mg/dL — AB (ref 0.3–1.2)
BUN: 83 mg/dL — ABNORMAL HIGH (ref 6–23)
CHLORIDE: 112 meq/L (ref 96–112)
CO2: 9 mEq/L — CL (ref 19–32)
Calcium: 9.4 mg/dL (ref 8.4–10.5)
Creatinine, Ser: 1.33 mg/dL (ref 0.50–1.35)
GFR calc Af Amer: 69 mL/min — ABNORMAL LOW (ref >90)
GFR calc non Af Amer: 60 mL/min — ABNORMAL LOW (ref >90)
GLUCOSE: 133 mg/dL — AB (ref 70–99)
POTASSIUM: 5.4 meq/L — AB (ref 3.7–5.3)
SODIUM: 136 meq/L — AB (ref 137–147)
TOTAL PROTEIN: 8.6 g/dL — AB (ref 6.0–8.3)

## 2013-11-23 LAB — ANAEROBIC CULTURE

## 2013-11-23 LAB — C-REACTIVE PROTEIN: CRP: 3.6 mg/dL — AB (ref <0.60)

## 2013-11-23 LAB — SEDIMENTATION RATE: Sed Rate: 125 mm/hr — ABNORMAL HIGH (ref 0–16)

## 2013-11-23 LAB — PREALBUMIN: Prealbumin: 23.5 mg/dL (ref 17.0–34.0)

## 2013-11-24 LAB — BASIC METABOLIC PANEL
Anion gap: 12 (ref 5–15)
BUN: 81 mg/dL — AB (ref 6–23)
CALCIUM: 9.3 mg/dL (ref 8.4–10.5)
CO2: 15 meq/L — AB (ref 19–32)
Chloride: 111 mEq/L (ref 96–112)
Creatinine, Ser: 1.49 mg/dL — ABNORMAL HIGH (ref 0.50–1.35)
GFR calc Af Amer: 60 mL/min — ABNORMAL LOW (ref >90)
GFR, EST NON AFRICAN AMERICAN: 52 mL/min — AB (ref >90)
GLUCOSE: 86 mg/dL (ref 70–99)
POTASSIUM: 5.1 meq/L (ref 3.7–5.3)
SODIUM: 138 meq/L (ref 137–147)

## 2013-11-26 LAB — BASIC METABOLIC PANEL
Anion gap: 15 (ref 5–15)
BUN: 85 mg/dL — AB (ref 6–23)
CALCIUM: 9.6 mg/dL (ref 8.4–10.5)
CO2: 11 meq/L — AB (ref 19–32)
Chloride: 111 mEq/L (ref 96–112)
Creatinine, Ser: 1.33 mg/dL (ref 0.50–1.35)
GFR calc Af Amer: 69 mL/min — ABNORMAL LOW (ref >90)
GFR, EST NON AFRICAN AMERICAN: 60 mL/min — AB (ref >90)
Glucose, Bld: 85 mg/dL (ref 70–99)
Potassium: 4.9 mEq/L (ref 3.7–5.3)
Sodium: 137 mEq/L (ref 137–147)

## 2013-11-26 LAB — CBC WITH DIFFERENTIAL/PLATELET
Basophils Absolute: 0.1 10*3/uL (ref 0.0–0.1)
Basophils Relative: 1 % (ref 0–1)
EOS ABS: 0.6 10*3/uL (ref 0.0–0.7)
EOS PCT: 5 % (ref 0–5)
HEMATOCRIT: 31.3 % — AB (ref 39.0–52.0)
Hemoglobin: 9.9 g/dL — ABNORMAL LOW (ref 13.0–17.0)
Lymphocytes Relative: 13 % (ref 12–46)
Lymphs Abs: 1.8 10*3/uL (ref 0.7–4.0)
MCH: 30.1 pg (ref 26.0–34.0)
MCHC: 31.6 g/dL (ref 30.0–36.0)
MCV: 95.1 fL (ref 78.0–100.0)
Monocytes Absolute: 0.9 10*3/uL (ref 0.1–1.0)
Monocytes Relative: 7 % (ref 3–12)
Neutro Abs: 10.5 10*3/uL — ABNORMAL HIGH (ref 1.7–7.7)
Neutrophils Relative %: 74 % (ref 43–77)
PLATELETS: 381 10*3/uL (ref 150–400)
RBC: 3.29 MIL/uL — AB (ref 4.22–5.81)
RDW: 16.8 % — ABNORMAL HIGH (ref 11.5–15.5)
WBC: 13.9 10*3/uL — AB (ref 4.0–10.5)

## 2013-11-30 LAB — COMPREHENSIVE METABOLIC PANEL
ALT: 46 U/L (ref 0–53)
AST: 18 U/L (ref 0–37)
Albumin: 2.9 g/dL — ABNORMAL LOW (ref 3.5–5.2)
Alkaline Phosphatase: 67 U/L (ref 39–117)
Anion gap: 14 (ref 5–15)
BUN: 80 mg/dL — ABNORMAL HIGH (ref 6–23)
CO2: 16 meq/L — AB (ref 19–32)
CREATININE: 1.5 mg/dL — AB (ref 0.50–1.35)
Calcium: 10 mg/dL (ref 8.4–10.5)
Chloride: 113 mEq/L — ABNORMAL HIGH (ref 96–112)
GFR calc Af Amer: 60 mL/min — ABNORMAL LOW (ref >90)
GFR, EST NON AFRICAN AMERICAN: 51 mL/min — AB (ref >90)
Glucose, Bld: 73 mg/dL (ref 70–99)
Potassium: 4.7 mEq/L (ref 3.7–5.3)
Sodium: 143 mEq/L (ref 137–147)
TOTAL PROTEIN: 8.8 g/dL — AB (ref 6.0–8.3)
Total Bilirubin: 0.2 mg/dL — ABNORMAL LOW (ref 0.3–1.2)

## 2013-11-30 LAB — CBC WITH DIFFERENTIAL/PLATELET
BASOS ABS: 0.1 10*3/uL (ref 0.0–0.1)
BASOS PCT: 0 % (ref 0–1)
EOS ABS: 0.6 10*3/uL (ref 0.0–0.7)
EOS PCT: 4 % (ref 0–5)
HCT: 33.1 % — ABNORMAL LOW (ref 39.0–52.0)
Hemoglobin: 10.3 g/dL — ABNORMAL LOW (ref 13.0–17.0)
LYMPHS PCT: 12 % (ref 12–46)
Lymphs Abs: 1.6 10*3/uL (ref 0.7–4.0)
MCH: 29.5 pg (ref 26.0–34.0)
MCHC: 31.1 g/dL (ref 30.0–36.0)
MCV: 94.8 fL (ref 78.0–100.0)
MONO ABS: 1 10*3/uL (ref 0.1–1.0)
Monocytes Relative: 8 % (ref 3–12)
Neutro Abs: 9.5 10*3/uL — ABNORMAL HIGH (ref 1.7–7.7)
Neutrophils Relative %: 76 % (ref 43–77)
PLATELETS: 363 10*3/uL (ref 150–400)
RBC: 3.49 MIL/uL — ABNORMAL LOW (ref 4.22–5.81)
RDW: 16.9 % — AB (ref 11.5–15.5)
WBC: 12.7 10*3/uL — ABNORMAL HIGH (ref 4.0–10.5)

## 2013-11-30 LAB — MAGNESIUM: MAGNESIUM: 2 mg/dL (ref 1.5–2.5)

## 2013-11-30 LAB — DIGOXIN LEVEL: Digoxin Level: 0.7 ng/mL — ABNORMAL LOW (ref 0.8–2.0)

## 2013-11-30 LAB — SEDIMENTATION RATE: SED RATE: 128 mm/h — AB (ref 0–16)

## 2013-11-30 LAB — PHOSPHORUS: PHOSPHORUS: 4 mg/dL (ref 2.3–4.6)

## 2013-11-30 LAB — C-REACTIVE PROTEIN: CRP: 8.6 mg/dL — ABNORMAL HIGH (ref <0.60)

## 2013-11-30 LAB — PREALBUMIN: PREALBUMIN: 24.1 mg/dL (ref 17.0–34.0)

## 2013-12-03 LAB — COMPREHENSIVE METABOLIC PANEL
ALBUMIN: 2.4 g/dL — AB (ref 3.5–5.2)
ALT: 66 U/L — ABNORMAL HIGH (ref 0–53)
AST: 33 U/L (ref 0–37)
Alkaline Phosphatase: 75 U/L (ref 39–117)
Anion gap: 15 (ref 5–15)
BUN: 84 mg/dL — AB (ref 6–23)
CALCIUM: 9.6 mg/dL (ref 8.4–10.5)
CO2: 17 mEq/L — ABNORMAL LOW (ref 19–32)
CREATININE: 1.67 mg/dL — AB (ref 0.50–1.35)
Chloride: 108 mEq/L (ref 96–112)
GFR calc Af Amer: 52 mL/min — ABNORMAL LOW (ref >90)
GFR calc non Af Amer: 45 mL/min — ABNORMAL LOW (ref >90)
Glucose, Bld: 141 mg/dL — ABNORMAL HIGH (ref 70–99)
Potassium: 4.2 mEq/L (ref 3.7–5.3)
Sodium: 140 mEq/L (ref 137–147)
Total Bilirubin: <0.2 mg/dL — ABNORMAL LOW (ref 0.3–1.2)
Total Protein: 7.8 g/dL (ref 6.0–8.3)

## 2013-12-03 LAB — CBC WITH DIFFERENTIAL/PLATELET
Basophils Absolute: 0 10*3/uL (ref 0.0–0.1)
Basophils Relative: 1 % (ref 0–1)
Eosinophils Absolute: 0.6 10*3/uL (ref 0.0–0.7)
Eosinophils Relative: 7 % — ABNORMAL HIGH (ref 0–5)
HCT: 32.2 % — ABNORMAL LOW (ref 39.0–52.0)
HEMOGLOBIN: 10.2 g/dL — AB (ref 13.0–17.0)
Lymphocytes Relative: 17 % (ref 12–46)
Lymphs Abs: 1.4 10*3/uL (ref 0.7–4.0)
MCH: 29.7 pg (ref 26.0–34.0)
MCHC: 31.7 g/dL (ref 30.0–36.0)
MCV: 93.9 fL (ref 78.0–100.0)
MONO ABS: 0.5 10*3/uL (ref 0.1–1.0)
MONOS PCT: 6 % (ref 3–12)
Neutro Abs: 5.9 10*3/uL (ref 1.7–7.7)
Neutrophils Relative %: 69 % (ref 43–77)
Platelets: 297 10*3/uL (ref 150–400)
RBC: 3.43 MIL/uL — ABNORMAL LOW (ref 4.22–5.81)
RDW: 16.5 % — ABNORMAL HIGH (ref 11.5–15.5)
WBC: 8.4 10*3/uL (ref 4.0–10.5)

## 2013-12-03 LAB — MAGNESIUM: MAGNESIUM: 1.8 mg/dL (ref 1.5–2.5)

## 2013-12-03 LAB — PHOSPHORUS: Phosphorus: 4.1 mg/dL (ref 2.3–4.6)

## 2013-12-04 LAB — BASIC METABOLIC PANEL
Anion gap: 13 (ref 5–15)
BUN: 81 mg/dL — ABNORMAL HIGH (ref 6–23)
CO2: 18 mEq/L — ABNORMAL LOW (ref 19–32)
Calcium: 9.6 mg/dL (ref 8.4–10.5)
Chloride: 107 mEq/L (ref 96–112)
Creatinine, Ser: 1.62 mg/dL — ABNORMAL HIGH (ref 0.50–1.35)
GFR, EST AFRICAN AMERICAN: 54 mL/min — AB (ref >90)
GFR, EST NON AFRICAN AMERICAN: 47 mL/min — AB (ref >90)
Glucose, Bld: 66 mg/dL — ABNORMAL LOW (ref 70–99)
POTASSIUM: 4.8 meq/L (ref 3.7–5.3)
SODIUM: 138 meq/L (ref 137–147)

## 2013-12-05 LAB — BASIC METABOLIC PANEL
Anion gap: 16 — ABNORMAL HIGH (ref 5–15)
BUN: 77 mg/dL — ABNORMAL HIGH (ref 6–23)
CO2: 15 mEq/L — ABNORMAL LOW (ref 19–32)
Calcium: 9.4 mg/dL (ref 8.4–10.5)
Chloride: 109 mEq/L (ref 96–112)
Creatinine, Ser: 1.59 mg/dL — ABNORMAL HIGH (ref 0.50–1.35)
GFR, EST AFRICAN AMERICAN: 56 mL/min — AB (ref >90)
GFR, EST NON AFRICAN AMERICAN: 48 mL/min — AB (ref >90)
Glucose, Bld: 91 mg/dL (ref 70–99)
POTASSIUM: 4.5 meq/L (ref 3.7–5.3)
Sodium: 140 mEq/L (ref 137–147)

## 2013-12-07 LAB — COMPREHENSIVE METABOLIC PANEL
ALBUMIN: 2.4 g/dL — AB (ref 3.5–5.2)
ALK PHOS: 72 U/L (ref 39–117)
ALT: 54 U/L — AB (ref 0–53)
AST: 22 U/L (ref 0–37)
Anion gap: 15 (ref 5–15)
BUN: 77 mg/dL — ABNORMAL HIGH (ref 6–23)
CO2: 20 mEq/L (ref 19–32)
Calcium: 9.4 mg/dL (ref 8.4–10.5)
Chloride: 106 mEq/L (ref 96–112)
Creatinine, Ser: 1.72 mg/dL — ABNORMAL HIGH (ref 0.50–1.35)
GFR calc Af Amer: 51 mL/min — ABNORMAL LOW (ref >90)
GFR calc non Af Amer: 44 mL/min — ABNORMAL LOW (ref >90)
Glucose, Bld: 95 mg/dL (ref 70–99)
POTASSIUM: 4.4 meq/L (ref 3.7–5.3)
SODIUM: 141 meq/L (ref 137–147)
TOTAL PROTEIN: 7.9 g/dL (ref 6.0–8.3)
Total Bilirubin: <0.2 mg/dL — ABNORMAL LOW (ref 0.3–1.2)

## 2013-12-07 LAB — CBC WITH DIFFERENTIAL/PLATELET
BASOS ABS: 0 10*3/uL (ref 0.0–0.1)
BASOS PCT: 0 % (ref 0–1)
EOS ABS: 0.6 10*3/uL (ref 0.0–0.7)
Eosinophils Relative: 6 % — ABNORMAL HIGH (ref 0–5)
HCT: 31.1 % — ABNORMAL LOW (ref 39.0–52.0)
Hemoglobin: 10 g/dL — ABNORMAL LOW (ref 13.0–17.0)
Lymphocytes Relative: 14 % (ref 12–46)
Lymphs Abs: 1.5 10*3/uL (ref 0.7–4.0)
MCH: 30.3 pg (ref 26.0–34.0)
MCHC: 32.2 g/dL (ref 30.0–36.0)
MCV: 94.2 fL (ref 78.0–100.0)
Monocytes Absolute: 0.6 10*3/uL (ref 0.1–1.0)
Monocytes Relative: 6 % (ref 3–12)
NEUTROS PCT: 74 % (ref 43–77)
Neutro Abs: 8.3 10*3/uL — ABNORMAL HIGH (ref 1.7–7.7)
PLATELETS: 266 10*3/uL (ref 150–400)
RBC: 3.3 MIL/uL — AB (ref 4.22–5.81)
RDW: 16.2 % — ABNORMAL HIGH (ref 11.5–15.5)
WBC: 11.1 10*3/uL — ABNORMAL HIGH (ref 4.0–10.5)

## 2013-12-07 LAB — C-REACTIVE PROTEIN: CRP: 12.8 mg/dL — AB (ref <0.60)

## 2013-12-07 LAB — DIGOXIN LEVEL: Digoxin Level: 0.4 ng/mL — ABNORMAL LOW (ref 0.8–2.0)

## 2013-12-07 LAB — PREALBUMIN: Prealbumin: 15.6 mg/dL — ABNORMAL LOW (ref 17.0–34.0)

## 2013-12-07 LAB — SEDIMENTATION RATE: Sed Rate: 132 mm/hr — ABNORMAL HIGH (ref 0–16)

## 2013-12-08 LAB — BASIC METABOLIC PANEL
ANION GAP: 12 (ref 5–15)
BUN: 81 mg/dL — ABNORMAL HIGH (ref 6–23)
CHLORIDE: 107 meq/L (ref 96–112)
CO2: 20 mEq/L (ref 19–32)
Calcium: 9.4 mg/dL (ref 8.4–10.5)
Creatinine, Ser: 1.67 mg/dL — ABNORMAL HIGH (ref 0.50–1.35)
GFR calc Af Amer: 52 mL/min — ABNORMAL LOW (ref >90)
GFR, EST NON AFRICAN AMERICAN: 45 mL/min — AB (ref >90)
Glucose, Bld: 78 mg/dL (ref 70–99)
POTASSIUM: 4.3 meq/L (ref 3.7–5.3)
Sodium: 139 mEq/L (ref 137–147)

## 2013-12-10 LAB — CBC WITH DIFFERENTIAL/PLATELET
Basophils Absolute: 0 10*3/uL (ref 0.0–0.1)
Basophils Relative: 0 % (ref 0–1)
EOS PCT: 4 % (ref 0–5)
Eosinophils Absolute: 0.5 10*3/uL (ref 0.0–0.7)
HEMATOCRIT: 30.4 % — AB (ref 39.0–52.0)
Hemoglobin: 9.8 g/dL — ABNORMAL LOW (ref 13.0–17.0)
LYMPHS ABS: 1.3 10*3/uL (ref 0.7–4.0)
LYMPHS PCT: 11 % — AB (ref 12–46)
MCH: 29.8 pg (ref 26.0–34.0)
MCHC: 32.2 g/dL (ref 30.0–36.0)
MCV: 92.4 fL (ref 78.0–100.0)
MONO ABS: 0.8 10*3/uL (ref 0.1–1.0)
Monocytes Relative: 6 % (ref 3–12)
NEUTROS ABS: 9.4 10*3/uL — AB (ref 1.7–7.7)
Neutrophils Relative %: 79 % — ABNORMAL HIGH (ref 43–77)
Platelets: 258 10*3/uL (ref 150–400)
RBC: 3.29 MIL/uL — AB (ref 4.22–5.81)
RDW: 15.8 % — ABNORMAL HIGH (ref 11.5–15.5)
WBC: 12.1 10*3/uL — AB (ref 4.0–10.5)

## 2013-12-10 LAB — BASIC METABOLIC PANEL
Anion gap: 12 (ref 5–15)
BUN: 84 mg/dL — ABNORMAL HIGH (ref 6–23)
CALCIUM: 9.2 mg/dL (ref 8.4–10.5)
CHLORIDE: 106 meq/L (ref 96–112)
CO2: 20 meq/L (ref 19–32)
Creatinine, Ser: 1.53 mg/dL — ABNORMAL HIGH (ref 0.50–1.35)
GFR calc Af Amer: 58 mL/min — ABNORMAL LOW (ref >90)
GFR calc non Af Amer: 50 mL/min — ABNORMAL LOW (ref >90)
GLUCOSE: 92 mg/dL (ref 70–99)
Potassium: 4.3 mEq/L (ref 3.7–5.3)
SODIUM: 138 meq/L (ref 137–147)

## 2013-12-10 LAB — CK: Total CK: 53 U/L (ref 7–232)

## 2013-12-14 LAB — CBC WITH DIFFERENTIAL/PLATELET
BASOS ABS: 0 10*3/uL (ref 0.0–0.1)
Basophils Relative: 0 % (ref 0–1)
EOS PCT: 6 % — AB (ref 0–5)
Eosinophils Absolute: 0.7 10*3/uL (ref 0.0–0.7)
HEMATOCRIT: 28.7 % — AB (ref 39.0–52.0)
Hemoglobin: 9.2 g/dL — ABNORMAL LOW (ref 13.0–17.0)
LYMPHS ABS: 1.8 10*3/uL (ref 0.7–4.0)
Lymphocytes Relative: 15 % (ref 12–46)
MCH: 29.8 pg (ref 26.0–34.0)
MCHC: 32.1 g/dL (ref 30.0–36.0)
MCV: 92.9 fL (ref 78.0–100.0)
Monocytes Absolute: 0.6 10*3/uL (ref 0.1–1.0)
Monocytes Relative: 5 % (ref 3–12)
Neutro Abs: 8.9 10*3/uL — ABNORMAL HIGH (ref 1.7–7.7)
Neutrophils Relative %: 74 % (ref 43–77)
Platelets: 356 10*3/uL (ref 150–400)
RBC: 3.09 MIL/uL — ABNORMAL LOW (ref 4.22–5.81)
RDW: 15.6 % — AB (ref 11.5–15.5)
WBC: 12.1 10*3/uL — ABNORMAL HIGH (ref 4.0–10.5)

## 2013-12-14 LAB — MAGNESIUM: MAGNESIUM: 2.1 mg/dL (ref 1.5–2.5)

## 2013-12-14 LAB — COMPREHENSIVE METABOLIC PANEL
ALT: 84 U/L — ABNORMAL HIGH (ref 0–53)
AST: 37 U/L (ref 0–37)
Albumin: 2.4 g/dL — ABNORMAL LOW (ref 3.5–5.2)
Alkaline Phosphatase: 118 U/L — ABNORMAL HIGH (ref 39–117)
Anion gap: 13 (ref 5–15)
BUN: 67 mg/dL — ABNORMAL HIGH (ref 6–23)
CALCIUM: 9.7 mg/dL (ref 8.4–10.5)
CO2: 20 meq/L (ref 19–32)
Chloride: 106 mEq/L (ref 96–112)
Creatinine, Ser: 1.49 mg/dL — ABNORMAL HIGH (ref 0.50–1.35)
GFR, EST AFRICAN AMERICAN: 60 mL/min — AB (ref >90)
GFR, EST NON AFRICAN AMERICAN: 52 mL/min — AB (ref >90)
Glucose, Bld: 79 mg/dL (ref 70–99)
Potassium: 4.7 mEq/L (ref 3.7–5.3)
Sodium: 139 mEq/L (ref 137–147)
Total Protein: 7.9 g/dL (ref 6.0–8.3)

## 2013-12-14 LAB — C-REACTIVE PROTEIN: CRP: 11.7 mg/dL — ABNORMAL HIGH (ref <0.60)

## 2013-12-14 LAB — DIGOXIN LEVEL: Digoxin Level: 1.1 ng/mL (ref 0.8–2.0)

## 2013-12-14 LAB — PHOSPHORUS: Phosphorus: 4.2 mg/dL (ref 2.3–4.6)

## 2013-12-14 LAB — PREALBUMIN: Prealbumin: 11.3 mg/dL — ABNORMAL LOW (ref 17.0–34.0)

## 2013-12-14 LAB — SEDIMENTATION RATE: Sed Rate: 135 mm/hr — ABNORMAL HIGH (ref 0–16)

## 2015-05-31 IMAGING — CT CT ABD-PELV W/O CM
2 of 5 series · 17 of 46 positions shown, 19 images · non-contrast
Comparison: None.

CLINICAL DATA: Sepsis

EXAM:
CT ABDOMEN AND PELVIS WITHOUT CONTRAST
TECHNIQUE: Multidetector CT imaging of the abdomen and pelvis was performed
following the standard protocol without IV contrast.

[Series 3: abd/ pelvis 5.0 i30f 1 · axial · 0.71mm/px · z∈[+955,+1340]mm · 14 of 85 slices shown, 16 images]
[im 4/85  soft-tissue]
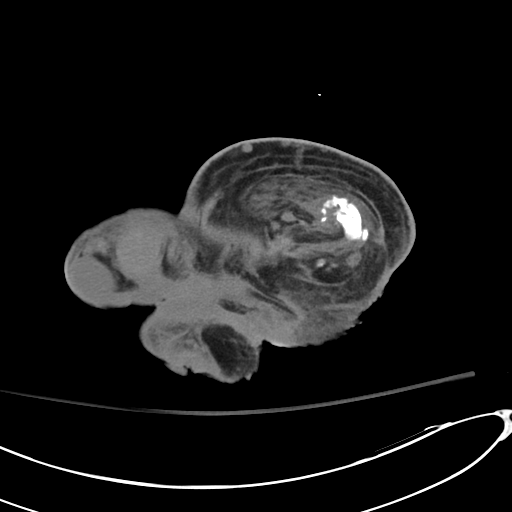
[im 4/85  bone]
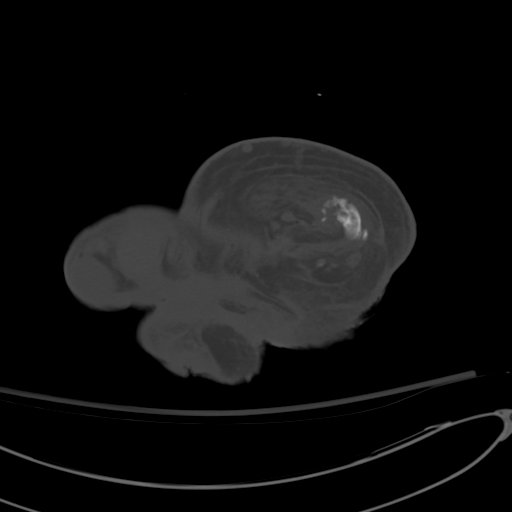
[im 11/85  soft-tissue]
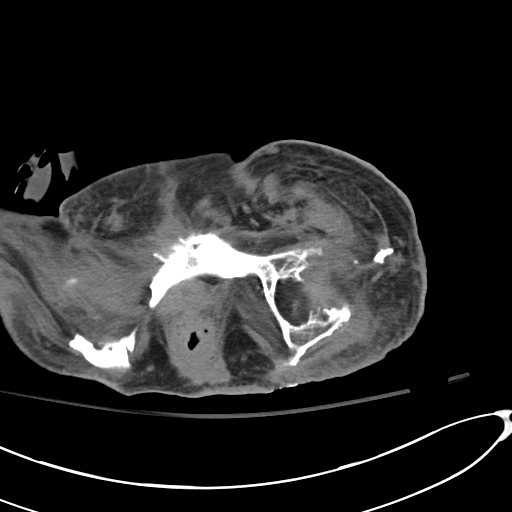
[im 17/85  soft-tissue]
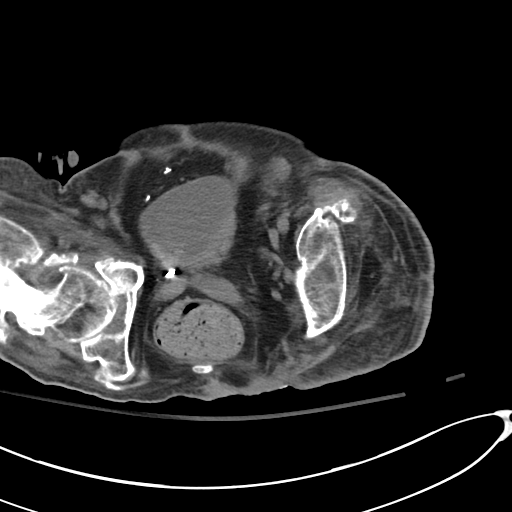
[im 24/85  soft-tissue]
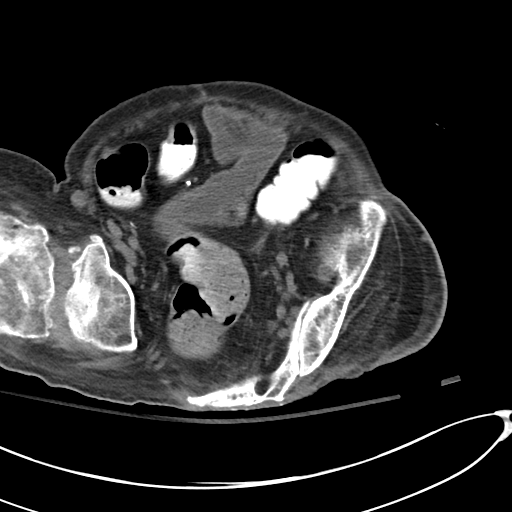
[im 27/85  soft-tissue]
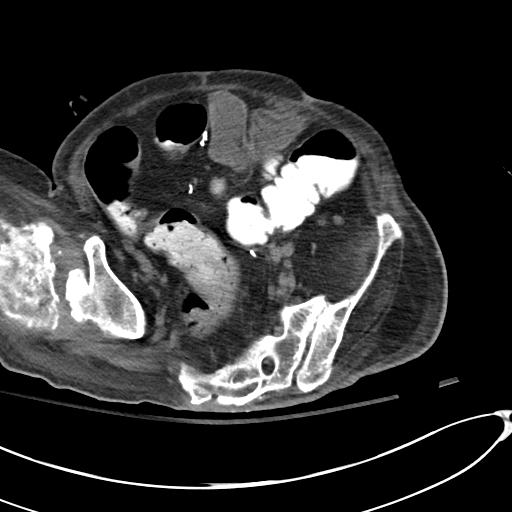
[im 34/85  soft-tissue]
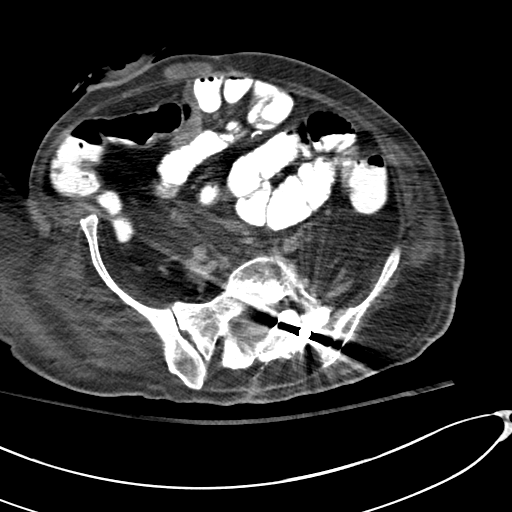
[im 41/85  soft-tissue]
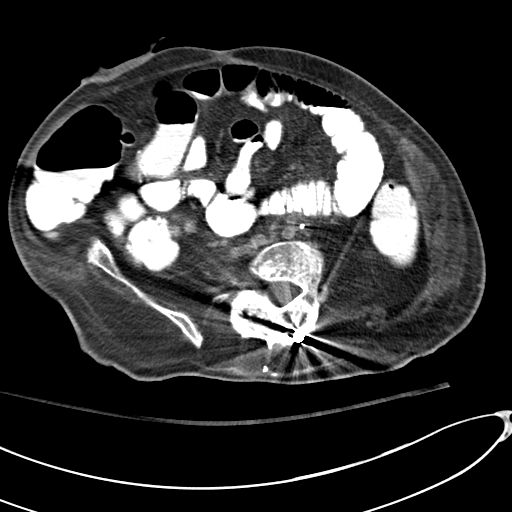
[im 44/85  soft-tissue]
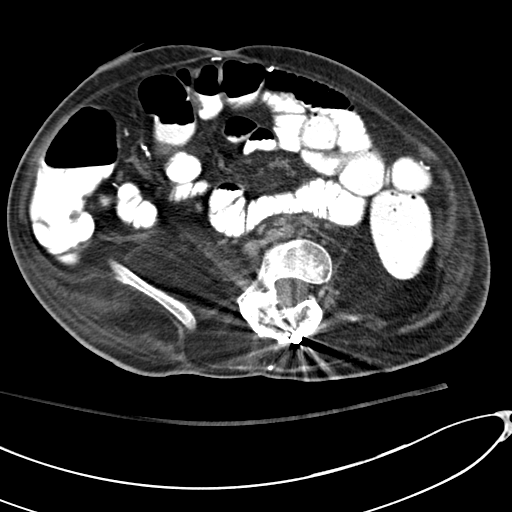
[im 51/85  soft-tissue]
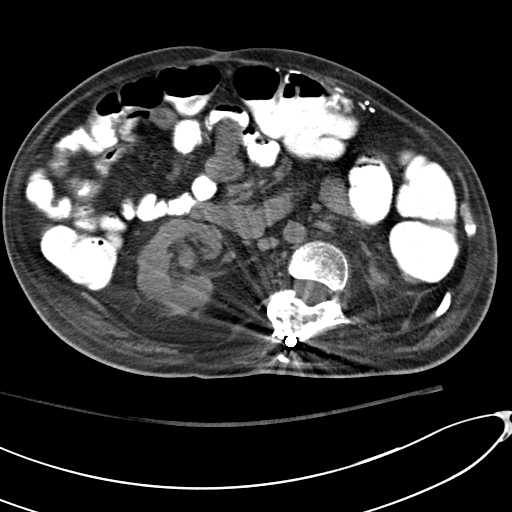
[im 51/85  bone]
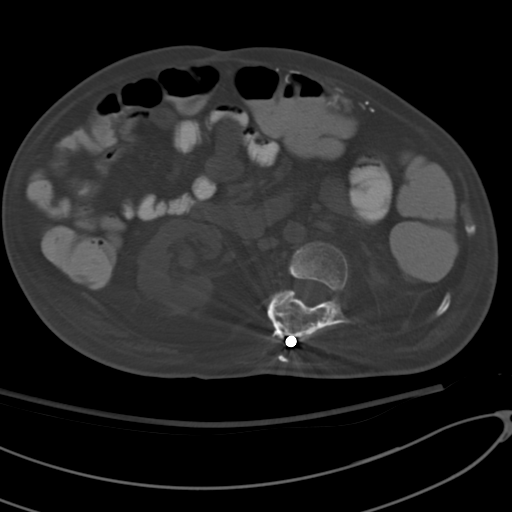
[im 58/85  soft-tissue]
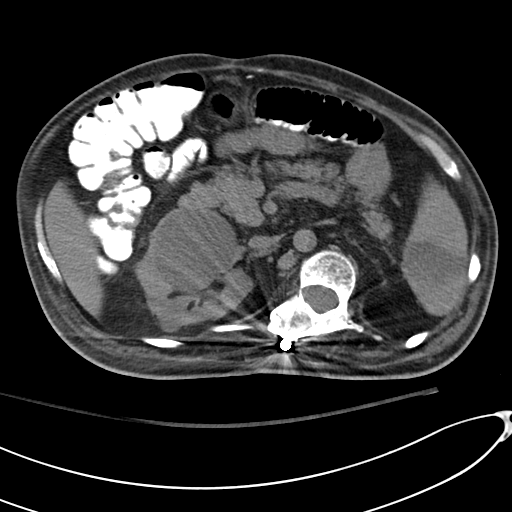
[im 64/85  soft-tissue]
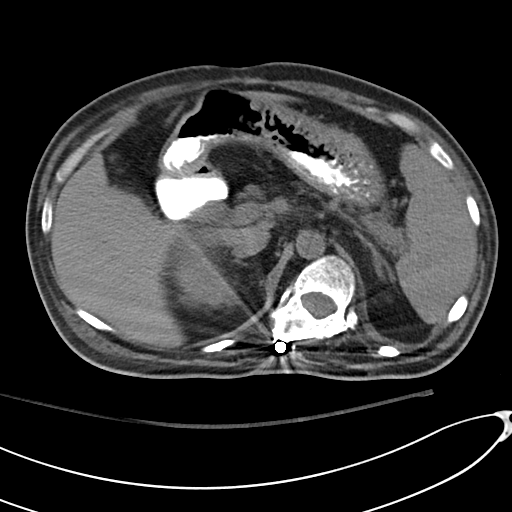
[im 68/85  soft-tissue]
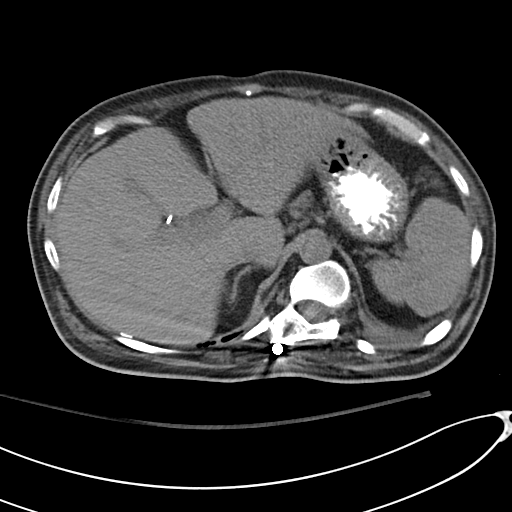
[im 74/85  soft-tissue]
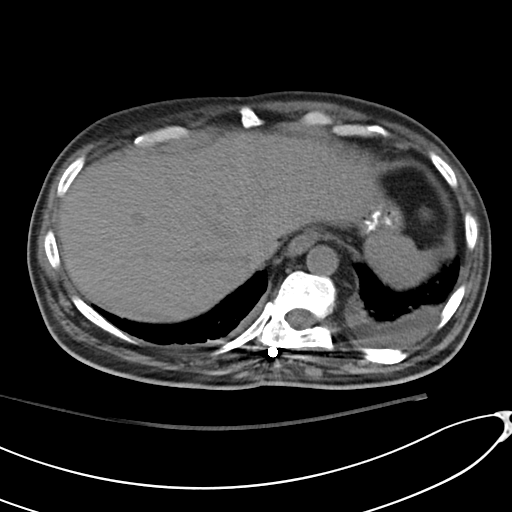
[im 81/85  soft-tissue]
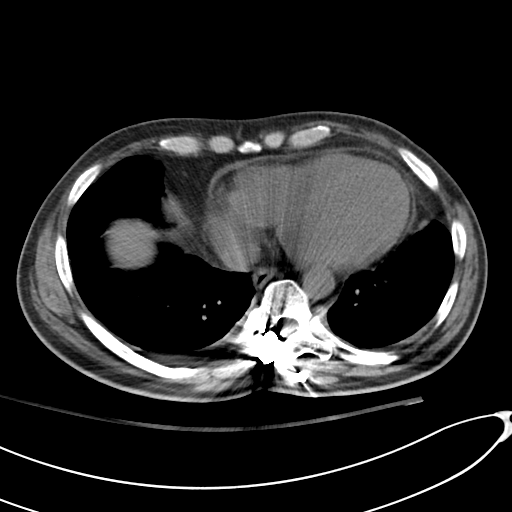

[Series 603: coronals · coronal · 0.82mm/px · 3 of 89 slices shown]
[im 30/89  soft-tissue]
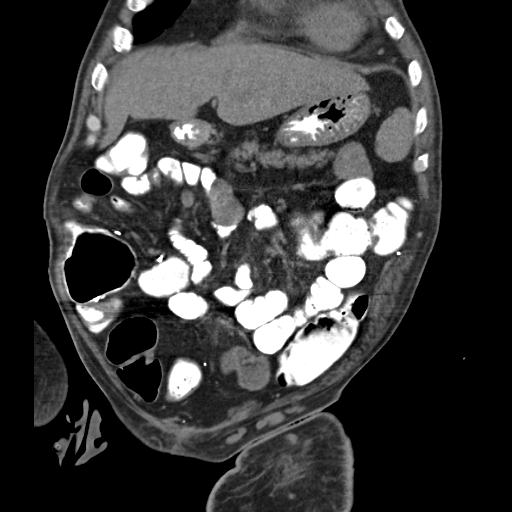
[im 40/89  soft-tissue]
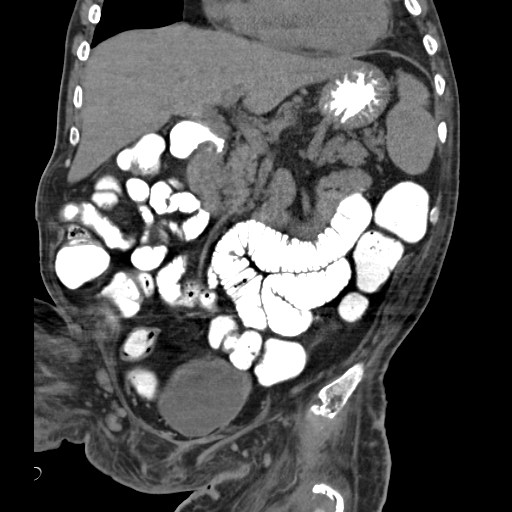
[im 49/89  soft-tissue]
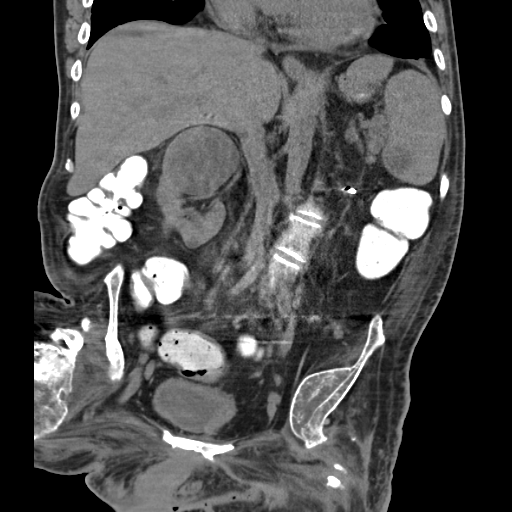

[17 of 46 positions shown; findings below may reference images not displayed]

FINDINGS: The lung bases are well aerated with small pleural effusions
bilaterally.

The gallbladder is been surgically removed. The liver is within
normal limits. A geographic area decreased attenuation is noted
within the spleen. This may represent a prior infarcts. The adrenal
glands and pancreas are within normal limits. The left kidney is
been surgically removed. The right kidney demonstrates multiple
cystic lesions. The largest of these measures 6.1 cm.

Contrast material extends throughout the small bowel and colon to
distal sigmoid colon. No obstructive changes are seen. Treated
scoliosis is again identified.

A urostomy is noted in the right lower quadrant extending to the
bladder. The bladder is partially distended. Thickening of the
bladder wall is noted. Sacral decubitus ulcers are noted. No abscess
cavity is noted. Chronic changes in the proximal left femur are
seen. No definitive bony erosion to suggest osteomyelitis is noted
at this time.
IMPRESSION: Changes consistent with a urostomy which attached is to the anterior
superior aspect of the bladder on the left. No obstructive changes
are noted.

Significant chronic changes in the pelvic bony structures without
evidence of osteomyelitis.

Changes consistent with sacral decubitus ulcers without definitive
subcutaneous abscess.

No colonic obstruction is noted. Contrast material travels to the
distal sigmoid. Fecal material is noted throughout the colon.

## 2022-04-19 DEATH — deceased
